# Patient Record
Sex: Female | Born: 1945 | Race: White | Hispanic: No | Smoking: Never smoker
Health system: Southern US, Community
[De-identification: ages and names within clinical notes are randomized; demographics above are authoritative.]

## PROBLEM LIST (undated history)

## (undated) DIAGNOSIS — E119 Type 2 diabetes mellitus without complications: Secondary | ICD-10-CM

## (undated) DIAGNOSIS — I10 Essential (primary) hypertension: Secondary | ICD-10-CM

## (undated) DIAGNOSIS — M8580 Other specified disorders of bone density and structure, unspecified site: Secondary | ICD-10-CM

## (undated) DIAGNOSIS — Z9109 Other allergy status, other than to drugs and biological substances: Secondary | ICD-10-CM

## (undated) HISTORY — DX: Other specified disorders of bone density and structure, unspecified site: M85.80

## (undated) HISTORY — DX: Other allergy status, other than to drugs and biological substances: Z91.09

---

## 1998-06-07 ENCOUNTER — Other Ambulatory Visit: Admission: RE | Admit: 1998-06-07 | Discharge: 1998-06-07 | Payer: Self-pay | Admitting: Obstetrics and Gynecology

## 2000-10-06 ENCOUNTER — Other Ambulatory Visit: Admission: RE | Admit: 2000-10-06 | Discharge: 2000-10-06 | Payer: Self-pay | Admitting: Obstetrics and Gynecology

## 2001-12-06 ENCOUNTER — Encounter: Admission: RE | Admit: 2001-12-06 | Discharge: 2002-03-06 | Payer: Self-pay | Admitting: Internal Medicine

## 2008-03-10 ENCOUNTER — Telehealth: Payer: Self-pay | Admitting: Internal Medicine

## 2012-07-30 ENCOUNTER — Emergency Department (HOSPITAL_BASED_OUTPATIENT_CLINIC_OR_DEPARTMENT_OTHER)
Admission: EM | Admit: 2012-07-30 | Discharge: 2012-07-30 | Disposition: A | Discharge status: Home / Self Care | Payer: BC Managed Care – PPO | Attending: Emergency Medicine | Admitting: Emergency Medicine

## 2012-07-30 ENCOUNTER — Encounter (HOSPITAL_BASED_OUTPATIENT_CLINIC_OR_DEPARTMENT_OTHER): Payer: Self-pay | Admitting: *Deleted

## 2012-07-30 ENCOUNTER — Emergency Department (HOSPITAL_BASED_OUTPATIENT_CLINIC_OR_DEPARTMENT_OTHER): Payer: BC Managed Care – PPO

## 2012-07-30 DIAGNOSIS — S43014A Anterior dislocation of right humerus, initial encounter: Secondary | ICD-10-CM

## 2012-07-30 DIAGNOSIS — Y92410 Unspecified street and highway as the place of occurrence of the external cause: Secondary | ICD-10-CM | POA: Insufficient documentation

## 2012-07-30 DIAGNOSIS — E119 Type 2 diabetes mellitus without complications: Secondary | ICD-10-CM | POA: Insufficient documentation

## 2012-07-30 DIAGNOSIS — I10 Essential (primary) hypertension: Secondary | ICD-10-CM | POA: Insufficient documentation

## 2012-07-30 DIAGNOSIS — W1789XA Other fall from one level to another, initial encounter: Secondary | ICD-10-CM | POA: Insufficient documentation

## 2012-07-30 DIAGNOSIS — Y9301 Activity, walking, marching and hiking: Secondary | ICD-10-CM | POA: Insufficient documentation

## 2012-07-30 DIAGNOSIS — Z79899 Other long term (current) drug therapy: Secondary | ICD-10-CM | POA: Insufficient documentation

## 2012-07-30 DIAGNOSIS — Z7982 Long term (current) use of aspirin: Secondary | ICD-10-CM | POA: Insufficient documentation

## 2012-07-30 DIAGNOSIS — S43006A Unspecified dislocation of unspecified shoulder joint, initial encounter: Secondary | ICD-10-CM | POA: Insufficient documentation

## 2012-07-30 DIAGNOSIS — IMO0001 Reserved for inherently not codable concepts without codable children: Secondary | ICD-10-CM

## 2012-07-30 HISTORY — DX: Type 2 diabetes mellitus without complications: E11.9

## 2012-07-30 HISTORY — DX: Essential (primary) hypertension: I10

## 2012-07-30 MED ORDER — HYDROCODONE-ACETAMINOPHEN 5-500 MG PO TABS: 1.0000 | ORAL_TABLET | Freq: Four times a day (QID) | ORAL | Status: DC | PRN

## 2012-07-30 MED ORDER — HYDROMORPHONE HCL PF 1 MG/ML IJ SOLN
INTRAMUSCULAR | Status: AC
  Administered 2012-07-30: 1 mg
  Filled 2012-07-30: qty 1

## 2012-07-30 NOTE — ED Notes (Signed)
Pt amb to triage with slow, steady gait in nad. Pt reports trip and fall on uneven pavement today around 11am.  Pt states she has right arm pain "from shoulder to fingertip". Denies any head injury or loc, + radial pulses, rom + to wrist, fingers, decrease rom to shoulder and elbow due to pain, no swelling or deformity noted.

## 2012-07-30 NOTE — ED Provider Notes (Signed)
History     CSN: [id number]  Arrival date & time 07/30/12  1348   First MD Initiated Contact with Patient 07/30/12 1552      Chief Complaint  Patient presents with  . Fall    (Consider location/radiation/quality/duration/timing/severity/associated sxs/prior treatment) HPI Comments: Patient was walking on uneven pavement and caught her shoe and fell.  She landed on her right shoulder and elbow and is complaining of pain in these areas.  She had a fall several weeks ago in which she injured the same.  She was seen by her pcp and no xrays were done as she had normal range of motion.    Patient is a 66 y.o. female presenting with fall. The history is provided by the patient.  Fall The accident occurred 1 to 2 hours ago. The fall occurred while walking. She fell from a height of 1 to 2 ft. She landed on concrete. There was no blood loss. Point of impact: right arm and shoulder. Pain location: right arm and shoulder. The pain is moderate. She was ambulatory at the scene. There was no entrapment after the fall.    Past Medical History  Diagnosis Date  . Diabetes mellitus without complication   . Hypertension     History reviewed. No pertinent past surgical history.  History reviewed. No pertinent family history.  History  Substance Use Topics  . Smoking status: Never Smoker   . Smokeless tobacco: Not on file  . Alcohol Use:     OB History    Grav Para Term Preterm Abortions TAB SAB Ect Mult Living                  Review of Systems  All other systems reviewed and are negative.    Allergies  Erythromycin  Home Medications   Current Outpatient Rx  Name  Route  Sig  Dispense  Refill  . ASPIRIN 81 MG PO TABS   Oral   Take 81 mg by mouth daily.         . ATENOLOL-CHLORTHALIDONE 50-25 MG PO TABS   Oral   Take 0.5 tablets by mouth daily.         . ATORVASTATIN CALCIUM 20 MG PO TABS   Oral   Take 20 mg by mouth daily.         . DESLORATADINE 5 MG PO TABS  Oral   Take 5 mg by mouth daily.         Marland Kitchen EXENATIDE 2 MG Hendricks SUSR   Subcutaneous   Inject into the skin once a week.         Marland Kitchen GLIPIZIDE 10 MG PO TABS   Oral   Take 10 mg by mouth 2 (two) times daily before a meal.         . METFORMIN HCL 500 MG PO TABS   Oral   Take 500 mg by mouth 2 (two) times daily with a meal.           BP 159/76  Pulse 83  Temp 98.2 F (36.8 C) (Oral)  Resp 18  SpO2 100%  Physical Exam  Nursing note and vitals reviewed. Constitutional: She is oriented to person, place, and time. She appears well-developed and well-nourished. No distress.  HENT:  Head: Normocephalic and atraumatic.  Neck: Normal range of motion. Neck supple.  Musculoskeletal:       The right shoulder and elbow appear grossly normal.  There is pain with range of motion.  The distal right extremity is neurovascularly intact.  Neurological: She is alert and oriented to person, place, and time.  Skin: Skin is warm and dry. She is not diaphoretic.    ED Course  Reduction of dislocation Performed by: Geoffery Lyons Authorized by: Geoffery Lyons Consent: Verbal consent obtained. Written consent not obtained. Risks and benefits: risks, benefits and alternatives were discussed Consent given by: patient Patient understanding: patient states understanding of the procedure being performed Patient identity confirmed: verbally with patient and arm band Local anesthesia used: no Patient sedated: no Patient tolerance: Patient tolerated the procedure well with no immediate complications. Comments: Shoulder reduced with traction/counter-traction technique.   (including critical care time)  Labs Reviewed - No data to display No results found.   No diagnosis found.    MDM  The xrays reveal a right shoulder dislocation.  She was given dilaudid and was more comfortable.  She did not want to be sedated and was willing to attempt reduction without further sedatives.  The right shoulder  was reduced with traction-countertraction technique and she tolerated this quite well.  The right upper extremity was neurovascularly intact pre and post reduction.  She was placed in a sling and swath, to follow up prn.        Geoffery Lyons, MD 07/31/12 [phone]

## 2012-08-31 ENCOUNTER — Other Ambulatory Visit: Payer: Self-pay | Admitting: Orthopaedic Surgery

## 2012-08-31 DIAGNOSIS — R52 Pain, unspecified: Secondary | ICD-10-CM

## 2012-08-31 DIAGNOSIS — R531 Weakness: Secondary | ICD-10-CM

## 2012-09-08 ENCOUNTER — Ambulatory Visit
Admission: RE | Admit: 2012-09-08 | Discharge: 2012-09-08 | Disposition: A | Discharge status: Home / Self Care | Payer: BC Managed Care – PPO | Source: Ambulatory Visit | Attending: Orthopaedic Surgery | Admitting: Orthopaedic Surgery

## 2012-09-08 DIAGNOSIS — R531 Weakness: Secondary | ICD-10-CM

## 2012-09-08 DIAGNOSIS — R52 Pain, unspecified: Secondary | ICD-10-CM

## 2012-09-22 HISTORY — PX: SHOULDER OPEN ROTATOR CUFF REPAIR: SHX2407

## 2012-10-08 ENCOUNTER — Ambulatory Visit: Payer: BC Managed Care – PPO | Attending: Orthopaedic Surgery | Admitting: Rehabilitation

## 2012-10-08 DIAGNOSIS — IMO0001 Reserved for inherently not codable concepts without codable children: Secondary | ICD-10-CM | POA: Insufficient documentation

## 2012-10-08 DIAGNOSIS — M25519 Pain in unspecified shoulder: Secondary | ICD-10-CM | POA: Insufficient documentation

## 2012-10-08 DIAGNOSIS — M25619 Stiffness of unspecified shoulder, not elsewhere classified: Secondary | ICD-10-CM | POA: Insufficient documentation

## 2012-10-11 ENCOUNTER — Ambulatory Visit: Payer: BC Managed Care – PPO | Admitting: Rehabilitation

## 2012-10-14 ENCOUNTER — Ambulatory Visit: Payer: BC Managed Care – PPO | Admitting: Rehabilitation

## 2012-10-18 ENCOUNTER — Ambulatory Visit: Payer: BC Managed Care – PPO | Admitting: Rehabilitation

## 2012-10-21 ENCOUNTER — Ambulatory Visit: Payer: BC Managed Care – PPO | Admitting: Rehabilitation

## 2012-10-25 ENCOUNTER — Ambulatory Visit: Payer: BC Managed Care – PPO | Attending: Orthopaedic Surgery | Admitting: Rehabilitation

## 2012-10-25 DIAGNOSIS — IMO0001 Reserved for inherently not codable concepts without codable children: Secondary | ICD-10-CM | POA: Insufficient documentation

## 2012-10-25 DIAGNOSIS — M25619 Stiffness of unspecified shoulder, not elsewhere classified: Secondary | ICD-10-CM | POA: Insufficient documentation

## 2012-10-25 DIAGNOSIS — M25519 Pain in unspecified shoulder: Secondary | ICD-10-CM | POA: Insufficient documentation

## 2012-10-29 ENCOUNTER — Ambulatory Visit: Payer: BC Managed Care – PPO | Admitting: Rehabilitation

## 2012-11-01 ENCOUNTER — Ambulatory Visit: Payer: BC Managed Care – PPO | Admitting: Rehabilitation

## 2012-11-04 ENCOUNTER — Ambulatory Visit: Payer: BC Managed Care – PPO | Admitting: Rehabilitation

## 2012-11-08 ENCOUNTER — Ambulatory Visit: Payer: BC Managed Care – PPO | Admitting: Rehabilitation

## 2012-11-11 ENCOUNTER — Ambulatory Visit: Payer: BC Managed Care – PPO | Admitting: Rehabilitation

## 2012-11-15 ENCOUNTER — Ambulatory Visit: Payer: BC Managed Care – PPO | Admitting: Rehabilitation

## 2012-11-18 ENCOUNTER — Ambulatory Visit: Payer: BC Managed Care – PPO | Admitting: Rehabilitation

## 2012-11-22 ENCOUNTER — Ambulatory Visit: Payer: Private Health Insurance - Indemnity | Attending: Orthopaedic Surgery | Admitting: Rehabilitation

## 2012-11-22 DIAGNOSIS — M25619 Stiffness of unspecified shoulder, not elsewhere classified: Secondary | ICD-10-CM | POA: Insufficient documentation

## 2012-11-22 DIAGNOSIS — IMO0001 Reserved for inherently not codable concepts without codable children: Secondary | ICD-10-CM | POA: Insufficient documentation

## 2012-11-22 DIAGNOSIS — M25519 Pain in unspecified shoulder: Secondary | ICD-10-CM | POA: Insufficient documentation

## 2012-11-24 ENCOUNTER — Ambulatory Visit: Payer: Private Health Insurance - Indemnity | Admitting: Rehabilitation

## 2012-11-26 ENCOUNTER — Encounter: Payer: BC Managed Care – PPO | Admitting: Rehabilitation

## 2012-11-29 ENCOUNTER — Ambulatory Visit: Payer: Private Health Insurance - Indemnity | Admitting: Rehabilitation

## 2012-12-02 ENCOUNTER — Ambulatory Visit: Payer: Private Health Insurance - Indemnity | Admitting: Rehabilitation

## 2012-12-06 ENCOUNTER — Ambulatory Visit: Payer: Private Health Insurance - Indemnity | Admitting: Rehabilitation

## 2012-12-08 ENCOUNTER — Ambulatory Visit: Payer: Private Health Insurance - Indemnity | Admitting: Rehabilitation

## 2012-12-13 ENCOUNTER — Ambulatory Visit: Payer: Private Health Insurance - Indemnity | Admitting: Rehabilitation

## 2012-12-16 ENCOUNTER — Ambulatory Visit: Payer: Private Health Insurance - Indemnity | Admitting: Rehabilitation

## 2012-12-20 ENCOUNTER — Ambulatory Visit: Payer: Private Health Insurance - Indemnity | Admitting: Rehabilitation

## 2012-12-22 ENCOUNTER — Ambulatory Visit: Payer: Worker's Compensation | Attending: Orthopaedic Surgery | Admitting: Rehabilitation

## 2012-12-22 DIAGNOSIS — IMO0001 Reserved for inherently not codable concepts without codable children: Secondary | ICD-10-CM | POA: Insufficient documentation

## 2012-12-22 DIAGNOSIS — M25519 Pain in unspecified shoulder: Secondary | ICD-10-CM | POA: Insufficient documentation

## 2012-12-22 DIAGNOSIS — M25619 Stiffness of unspecified shoulder, not elsewhere classified: Secondary | ICD-10-CM | POA: Insufficient documentation

## 2012-12-27 ENCOUNTER — Ambulatory Visit: Payer: Worker's Compensation | Admitting: Rehabilitation

## 2012-12-29 ENCOUNTER — Ambulatory Visit: Payer: Worker's Compensation | Admitting: Rehabilitation

## 2013-01-03 ENCOUNTER — Encounter: Payer: Private Health Insurance - Indemnity | Admitting: Rehabilitation

## 2013-01-05 ENCOUNTER — Ambulatory Visit: Payer: Private Health Insurance - Indemnity | Attending: Orthopaedic Surgery | Admitting: Rehabilitation

## 2013-01-05 ENCOUNTER — Encounter: Payer: Private Health Insurance - Indemnity | Admitting: Rehabilitation

## 2013-01-05 DIAGNOSIS — M25519 Pain in unspecified shoulder: Secondary | ICD-10-CM | POA: Insufficient documentation

## 2013-01-05 DIAGNOSIS — M25619 Stiffness of unspecified shoulder, not elsewhere classified: Secondary | ICD-10-CM | POA: Insufficient documentation

## 2013-01-05 DIAGNOSIS — IMO0001 Reserved for inherently not codable concepts without codable children: Secondary | ICD-10-CM | POA: Insufficient documentation

## 2013-01-06 ENCOUNTER — Ambulatory Visit: Payer: Private Health Insurance - Indemnity | Admitting: Rehabilitation

## 2013-01-10 ENCOUNTER — Ambulatory Visit: Payer: Private Health Insurance - Indemnity | Admitting: Rehabilitation

## 2013-01-12 ENCOUNTER — Ambulatory Visit: Payer: Private Health Insurance - Indemnity | Admitting: Rehabilitation

## 2013-01-17 ENCOUNTER — Ambulatory Visit: Payer: Private Health Insurance - Indemnity | Admitting: Rehabilitation

## 2013-01-19 ENCOUNTER — Ambulatory Visit: Payer: Private Health Insurance - Indemnity | Admitting: Rehabilitation

## 2013-01-24 ENCOUNTER — Ambulatory Visit: Payer: Worker's Compensation | Attending: Orthopaedic Surgery | Admitting: Rehabilitation

## 2013-01-24 DIAGNOSIS — IMO0001 Reserved for inherently not codable concepts without codable children: Secondary | ICD-10-CM | POA: Insufficient documentation

## 2013-01-24 DIAGNOSIS — M25619 Stiffness of unspecified shoulder, not elsewhere classified: Secondary | ICD-10-CM | POA: Insufficient documentation

## 2013-01-24 DIAGNOSIS — M25519 Pain in unspecified shoulder: Secondary | ICD-10-CM | POA: Insufficient documentation

## 2013-01-26 ENCOUNTER — Ambulatory Visit: Payer: Worker's Compensation | Admitting: Rehabilitation

## 2013-01-31 ENCOUNTER — Ambulatory Visit: Payer: Worker's Compensation | Admitting: Rehabilitation

## 2013-02-02 ENCOUNTER — Ambulatory Visit: Payer: Worker's Compensation | Admitting: Rehabilitation

## 2013-02-03 ENCOUNTER — Encounter: Payer: Self-pay | Admitting: Internal Medicine

## 2013-02-07 ENCOUNTER — Ambulatory Visit: Payer: Worker's Compensation | Admitting: Rehabilitation

## 2013-02-11 ENCOUNTER — Encounter: Payer: Self-pay | Admitting: Internal Medicine

## 2013-04-01 ENCOUNTER — Encounter: Payer: Self-pay | Admitting: Internal Medicine

## 2013-04-07 ENCOUNTER — Ambulatory Visit (AMBULATORY_SURGERY_CENTER): Payer: Private Health Insurance - Indemnity | Admitting: *Deleted

## 2013-04-07 VITALS — Ht 62.5 in | Wt 164.4 lb

## 2013-04-07 DIAGNOSIS — Z1211 Encounter for screening for malignant neoplasm of colon: Secondary | ICD-10-CM

## 2013-04-07 MED ORDER — MOVIPREP 100 G PO SOLR: 1.0000 | Freq: Once | ORAL | Status: DC

## 2013-04-07 NOTE — Progress Notes (Signed)
Denies allergies to eggs or soy products. Denies complications with anesthesia or sedation. 

## 2013-04-12 ENCOUNTER — Encounter: Payer: Self-pay | Admitting: Internal Medicine

## 2013-04-26 ENCOUNTER — Ambulatory Visit (AMBULATORY_SURGERY_CENTER): Payer: Private Health Insurance - Indemnity | Admitting: Internal Medicine

## 2013-04-26 ENCOUNTER — Encounter: Payer: Self-pay | Admitting: Internal Medicine

## 2013-04-26 VITALS — BP 107/47 | HR 71 | Temp 98.3°F | Resp 29 | Ht 62.5 in | Wt 164.0 lb

## 2013-04-26 DIAGNOSIS — Z1211 Encounter for screening for malignant neoplasm of colon: Secondary | ICD-10-CM

## 2013-04-26 DIAGNOSIS — D126 Benign neoplasm of colon, unspecified: Secondary | ICD-10-CM

## 2013-04-26 MED ORDER — SODIUM CHLORIDE 0.9 % IV SOLN: 500.0000 mL | INTRAVENOUS | Status: DC

## 2013-04-26 NOTE — Op Note (Signed)
[facility] [address] [place], [address]   COLONOSCOPY PROCEDURE REPORT  PATIENT: Kelsey, Carpenter  MR#: [id number] BIRTHDATE: 28-Oct-1945 , 66  yrs. old GENDER: Female ENDOSCOPIST: Roxy Cedar, MD REFERRED AO:[id number] Recall PROCEDURE DATE:  04/26/2013 PROCEDURE:   Colonoscopy with snare polypectomy x 1 First Screening Colonoscopy - Avg.  risk and is 50 yrs.  old or older - No.  Prior Negative Screening - Now for repeat screening. 10 or more years since last screening  History of Adenoma - Now for follow-up colonoscopy & has been > or = to 3 yrs.  N/A  Polyps Removed Today? Yes. ASA CLASS:   Class II INDICATIONS:average risk screening.   Negative index exam 02-2003 MEDICATIONS: MAC sedation, administered by CRNA and propofol (Diprivan) 350mg  IV  DESCRIPTION OF PROCEDURE:   After the risks benefits and alternatives of the procedure were thoroughly explained, informed consent was obtained.  A digital rectal exam revealed no abnormalities of the rectum.   The [id number]  endoscope was introduced through the anus and advanced to the cecum, which was identified by both the appendix and ileocecal valve. No adverse events experienced.   The quality of the prep was excellent, using MoviPrep  The instrument was then slowly withdrawn as the colon was fully examined.    COLON FINDINGS: A diminutive polyp was found in the ascending colon. A polypectomy was performed with a cold snare.  The resection was complete and the polyp tissue was completely retrieved.   The colon mucosa was otherwise normal.  Retroflexed views revealed no abnormalities. The time to cecum=7 minutes 02 seconds.  Withdrawal time=12 minutes 34 seconds.  The scope was withdrawn and the procedure completed. COMPLICATIONS: There were no complications.  ENDOSCOPIC IMPRESSION: 1.   Diminutive polyp was found in the ascending colon; polypectomy was performed with a cold snare 2.   The  colon mucosa was otherwise normal  RECOMMENDATIONS: 1. Repeat colonoscopy in 5 years if polyp adenomatous; otherwise 10 years   eSigned:  Roxy Cedar, MD 04/26/2013 11:21 AM   cc: Geoffry Paradise, MD and The Patient   PATIENT NAME:  Kelsey, Carpenter MR#: [id number]

## 2013-04-26 NOTE — Progress Notes (Signed)
Called to room to assist during endoscopic procedure.  Patient ID and intended procedure confirmed with present staff. Received instructions for my participation in the procedure from the performing physician.  

## 2013-04-26 NOTE — Patient Instructions (Addendum)

## 2013-04-26 NOTE — Progress Notes (Signed)
Patient did not have preoperative order for IV antibiotic SSI prophylaxis. (G8918)  Patient did not experience any of the following events: a burn prior to discharge; a fall within the facility; wrong site/side/patient/procedure/implant event; or a hospital transfer or hospital admission upon discharge from the facility. (G8907)  

## 2013-04-27 ENCOUNTER — Telehealth: Payer: Self-pay | Admitting: *Deleted

## 2013-04-27 NOTE — Telephone Encounter (Signed)
  Follow up Call-  Call back number 04/26/2013  Post procedure Call Back phone  # [phone]  Permission to leave phone message Yes     Patient questions:  Do you have a fever, pain , or abdominal swelling? no Pain Score  0 *  Have you tolerated food without any problems? yes  Have you been able to return to your normal activities? yes  Do you have any questions about your discharge instructions: Diet   no Medications  no Follow up visit  no  Do you have questions or concerns about your Care? no  Actions: * If pain score is 4 or above: No action needed, pain <4.

## 2013-05-09 ENCOUNTER — Encounter: Payer: Self-pay | Admitting: Internal Medicine

## 2013-07-01 DIAGNOSIS — J309 Allergic rhinitis, unspecified: Secondary | ICD-10-CM | POA: Diagnosis not present

## 2013-07-01 DIAGNOSIS — Z1231 Encounter for screening mammogram for malignant neoplasm of breast: Secondary | ICD-10-CM | POA: Diagnosis not present

## 2013-07-11 DIAGNOSIS — J309 Allergic rhinitis, unspecified: Secondary | ICD-10-CM | POA: Diagnosis not present

## 2013-07-11 IMAGING — CR DG SHOULDER 2+V*R*
3 series · 3 of 3 positions shown · non-contrast
Comparison: None.

CLINICAL DATA: Post fall

RIGHT SHOULDER - 2+ VIEW

[w shoulder ap internal righ]
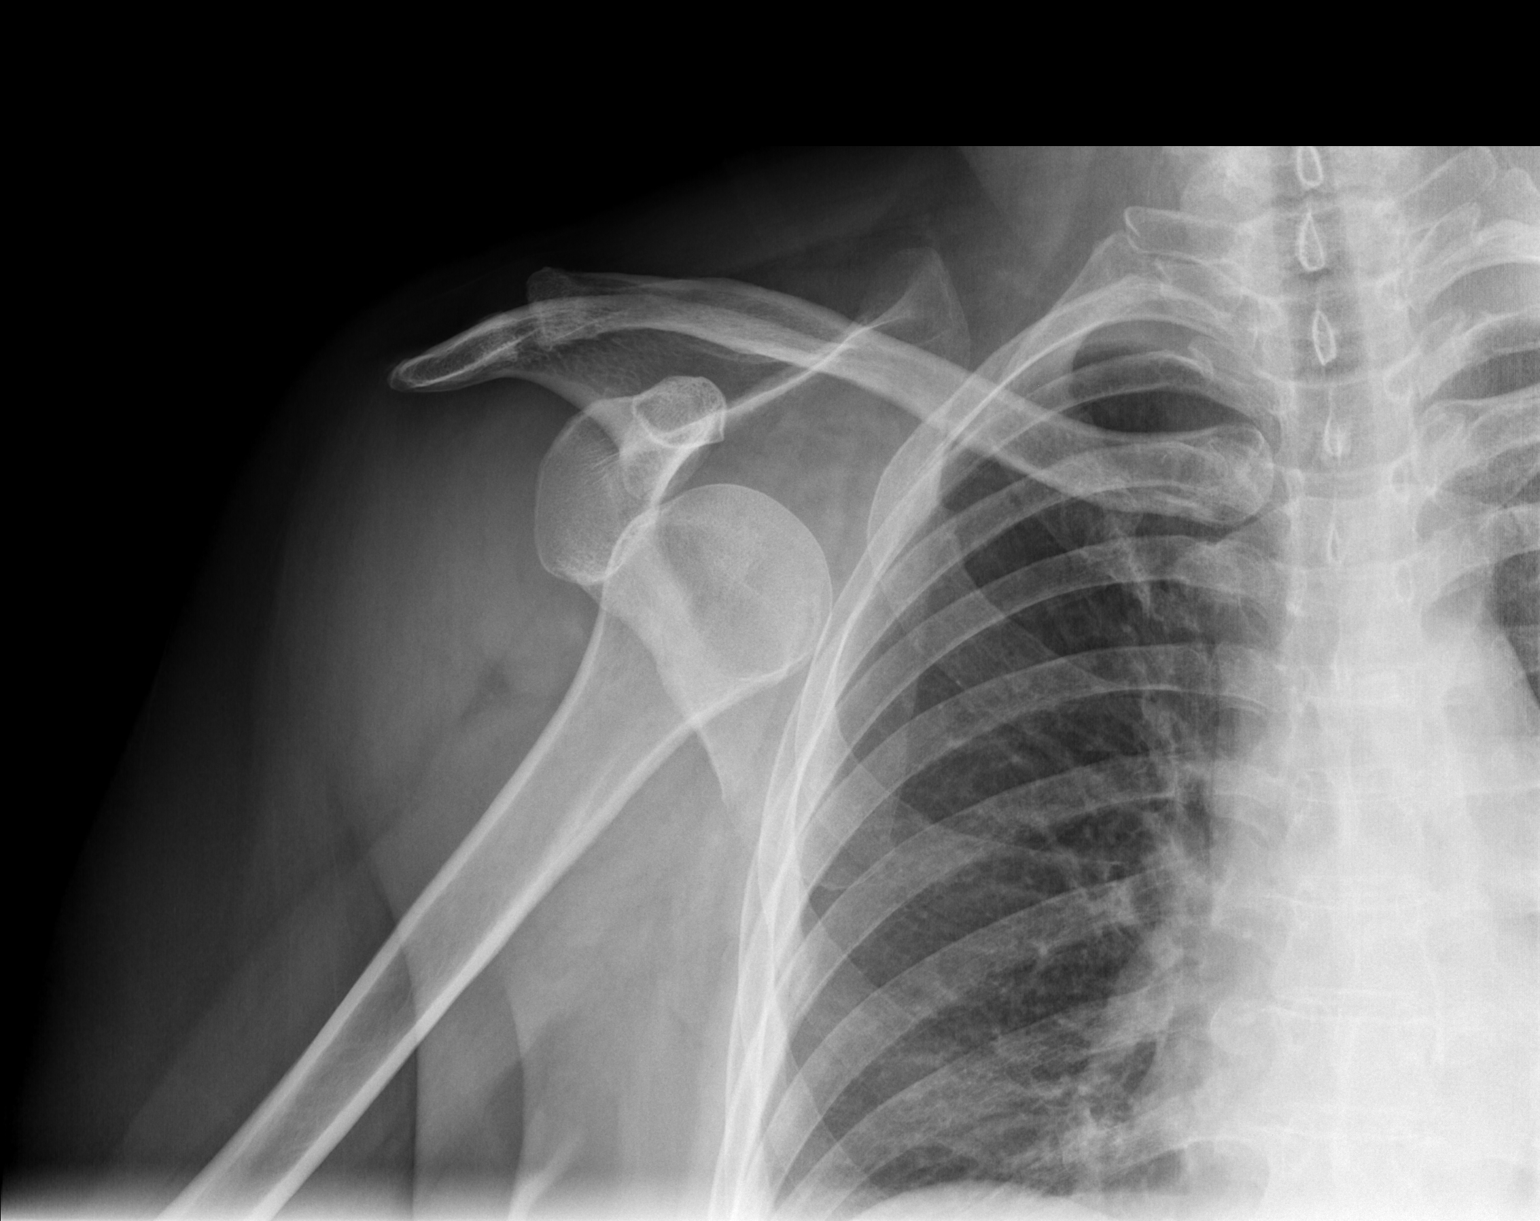

[w shoulder ap external righ]
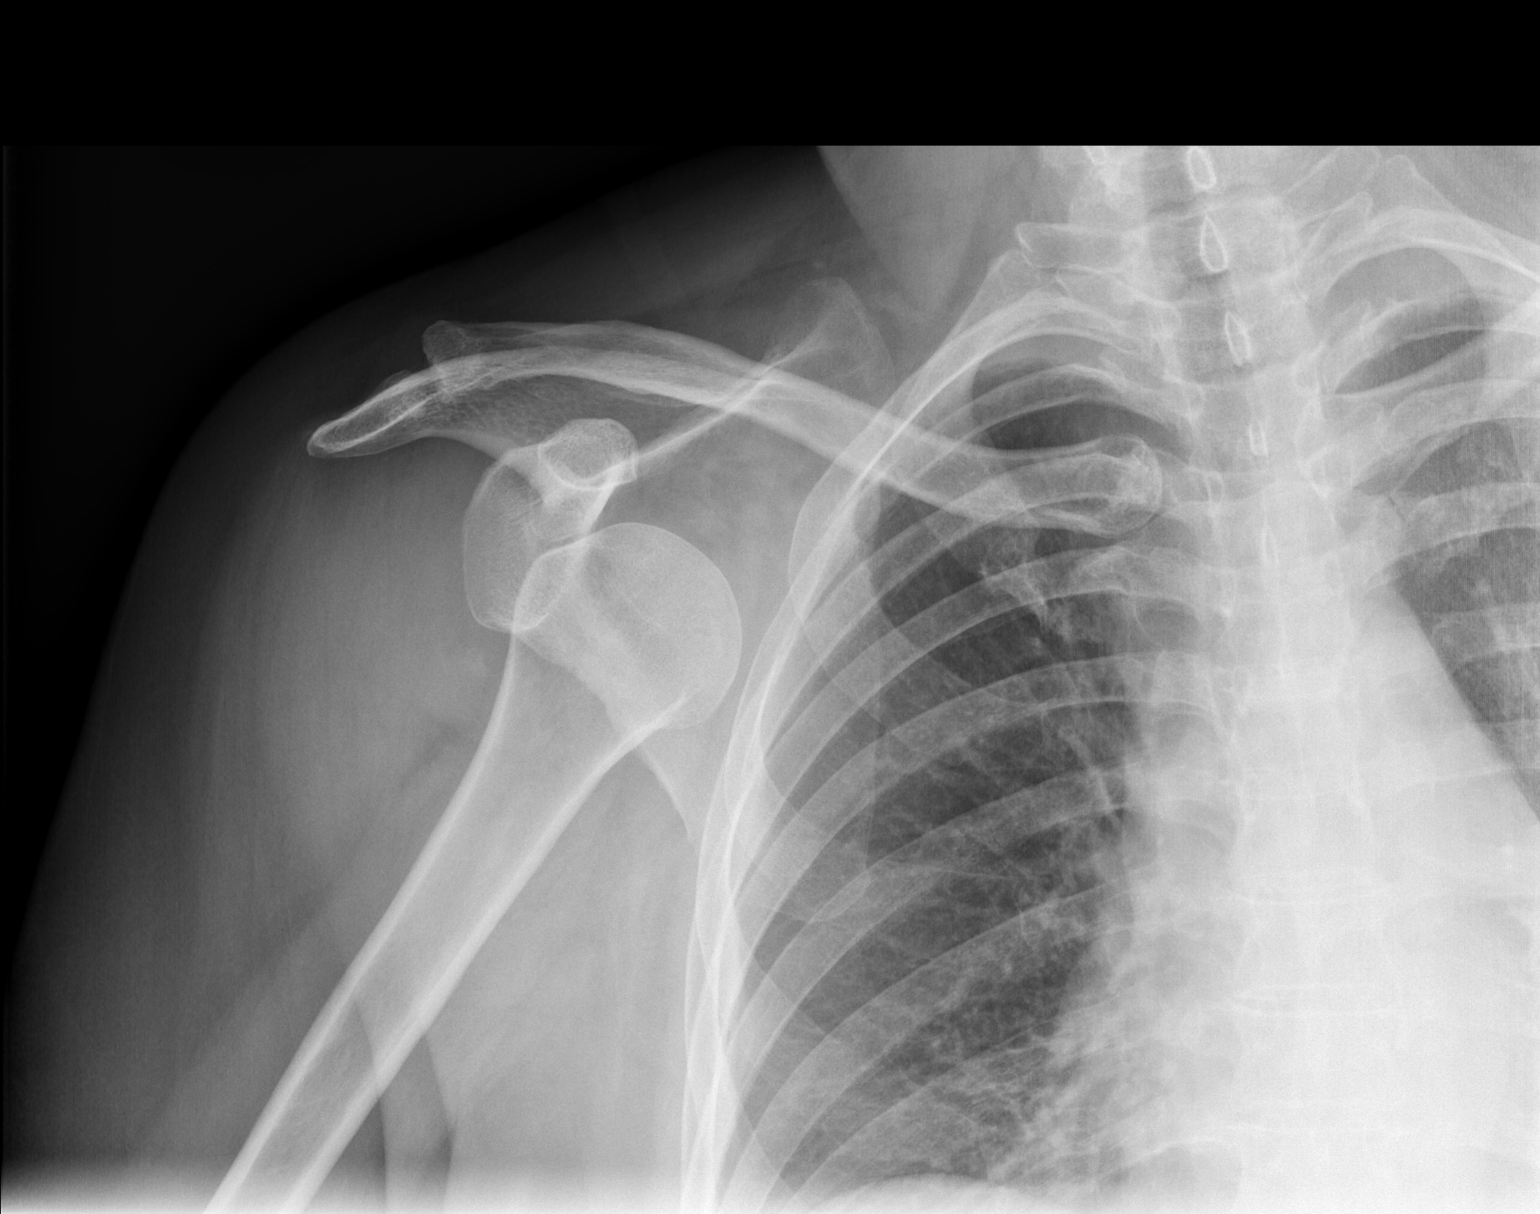

[w shoulder y view right]
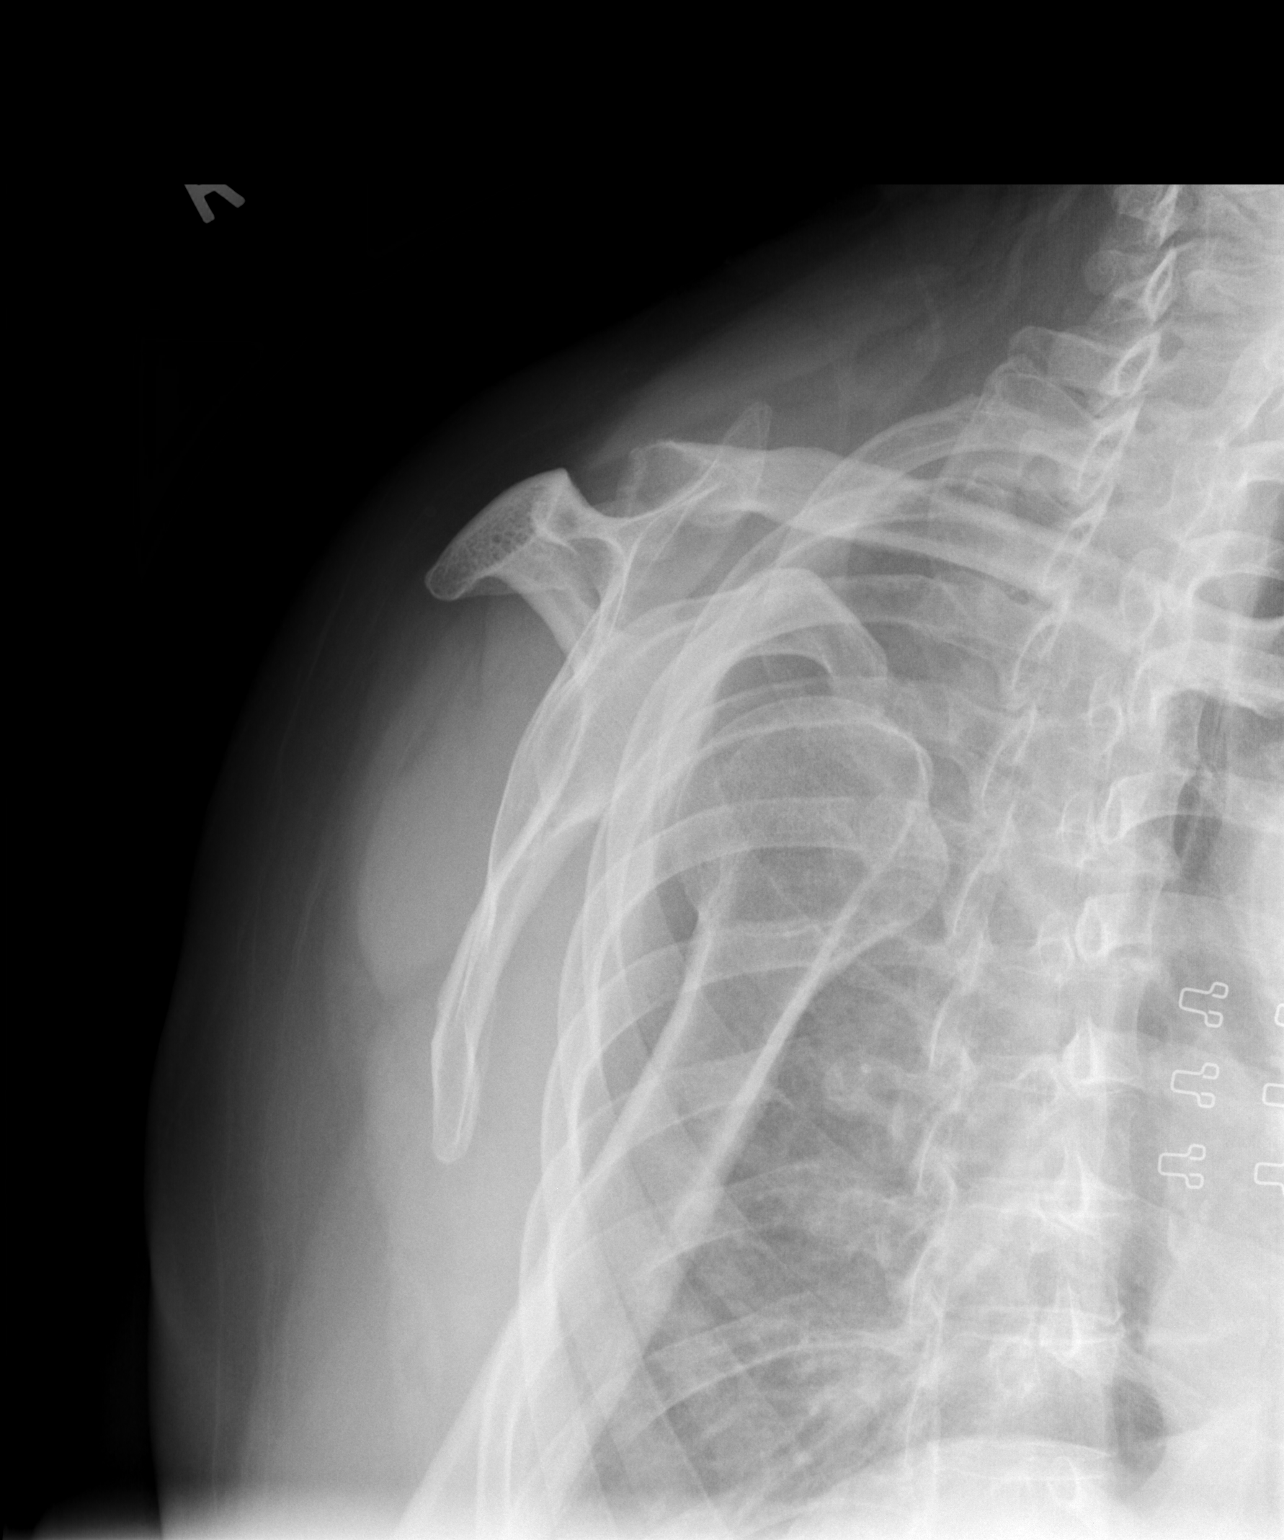

[3 of 3 positions shown; findings below may reference images not displayed]

FINDINGS: Three views of the right shoulder submitted.  There is
anterior subluxation of the right humeral head from glenohumeral
joint. No acute fracture is identified.
IMPRESSION: Anterior shoulder dislocation.

## 2013-07-21 DIAGNOSIS — J309 Allergic rhinitis, unspecified: Secondary | ICD-10-CM | POA: Diagnosis not present

## 2013-08-01 DIAGNOSIS — J309 Allergic rhinitis, unspecified: Secondary | ICD-10-CM | POA: Diagnosis not present

## 2013-08-16 DIAGNOSIS — J309 Allergic rhinitis, unspecified: Secondary | ICD-10-CM | POA: Diagnosis not present

## 2013-08-26 DIAGNOSIS — Z6829 Body mass index (BMI) 29.0-29.9, adult: Secondary | ICD-10-CM | POA: Diagnosis not present

## 2013-08-26 DIAGNOSIS — E785 Hyperlipidemia, unspecified: Secondary | ICD-10-CM | POA: Diagnosis not present

## 2013-08-26 DIAGNOSIS — M1990 Unspecified osteoarthritis, unspecified site: Secondary | ICD-10-CM | POA: Diagnosis not present

## 2013-08-26 DIAGNOSIS — E119 Type 2 diabetes mellitus without complications: Secondary | ICD-10-CM | POA: Diagnosis not present

## 2013-08-26 DIAGNOSIS — Z1331 Encounter for screening for depression: Secondary | ICD-10-CM | POA: Diagnosis not present

## 2013-08-26 DIAGNOSIS — I10 Essential (primary) hypertension: Secondary | ICD-10-CM | POA: Diagnosis not present

## 2013-08-26 DIAGNOSIS — J309 Allergic rhinitis, unspecified: Secondary | ICD-10-CM | POA: Diagnosis not present

## 2013-09-06 DIAGNOSIS — J309 Allergic rhinitis, unspecified: Secondary | ICD-10-CM | POA: Diagnosis not present

## 2013-09-19 DIAGNOSIS — Z01419 Encounter for gynecological examination (general) (routine) without abnormal findings: Secondary | ICD-10-CM | POA: Diagnosis not present

## 2013-09-19 DIAGNOSIS — J309 Allergic rhinitis, unspecified: Secondary | ICD-10-CM | POA: Diagnosis not present

## 2013-09-19 DIAGNOSIS — Z124 Encounter for screening for malignant neoplasm of cervix: Secondary | ICD-10-CM | POA: Diagnosis not present

## 2013-09-30 DIAGNOSIS — J309 Allergic rhinitis, unspecified: Secondary | ICD-10-CM | POA: Diagnosis not present

## 2013-10-10 DIAGNOSIS — J309 Allergic rhinitis, unspecified: Secondary | ICD-10-CM | POA: Diagnosis not present

## 2013-10-11 DIAGNOSIS — J309 Allergic rhinitis, unspecified: Secondary | ICD-10-CM | POA: Diagnosis not present

## 2013-10-20 DIAGNOSIS — J309 Allergic rhinitis, unspecified: Secondary | ICD-10-CM | POA: Diagnosis not present

## 2013-10-31 DIAGNOSIS — J309 Allergic rhinitis, unspecified: Secondary | ICD-10-CM | POA: Diagnosis not present

## 2013-11-11 DIAGNOSIS — J309 Allergic rhinitis, unspecified: Secondary | ICD-10-CM | POA: Diagnosis not present

## 2013-11-21 DIAGNOSIS — J309 Allergic rhinitis, unspecified: Secondary | ICD-10-CM | POA: Diagnosis not present

## 2013-11-25 DIAGNOSIS — J309 Allergic rhinitis, unspecified: Secondary | ICD-10-CM | POA: Diagnosis not present

## 2013-11-28 DIAGNOSIS — J309 Allergic rhinitis, unspecified: Secondary | ICD-10-CM | POA: Diagnosis not present

## 2013-12-01 DIAGNOSIS — J309 Allergic rhinitis, unspecified: Secondary | ICD-10-CM | POA: Diagnosis not present

## 2013-12-05 DIAGNOSIS — J309 Allergic rhinitis, unspecified: Secondary | ICD-10-CM | POA: Diagnosis not present

## 2013-12-15 DIAGNOSIS — J309 Allergic rhinitis, unspecified: Secondary | ICD-10-CM | POA: Diagnosis not present

## 2013-12-26 DIAGNOSIS — J309 Allergic rhinitis, unspecified: Secondary | ICD-10-CM | POA: Diagnosis not present

## 2013-12-30 DIAGNOSIS — I10 Essential (primary) hypertension: Secondary | ICD-10-CM | POA: Diagnosis not present

## 2013-12-30 DIAGNOSIS — Z6829 Body mass index (BMI) 29.0-29.9, adult: Secondary | ICD-10-CM | POA: Diagnosis not present

## 2013-12-30 DIAGNOSIS — E785 Hyperlipidemia, unspecified: Secondary | ICD-10-CM | POA: Diagnosis not present

## 2013-12-30 DIAGNOSIS — E119 Type 2 diabetes mellitus without complications: Secondary | ICD-10-CM | POA: Diagnosis not present

## 2013-12-30 DIAGNOSIS — E669 Obesity, unspecified: Secondary | ICD-10-CM | POA: Diagnosis not present

## 2014-01-06 DIAGNOSIS — J309 Allergic rhinitis, unspecified: Secondary | ICD-10-CM | POA: Diagnosis not present

## 2014-01-18 DIAGNOSIS — J309 Allergic rhinitis, unspecified: Secondary | ICD-10-CM | POA: Diagnosis not present

## 2014-01-26 DIAGNOSIS — H5010 Unspecified exotropia: Secondary | ICD-10-CM | POA: Diagnosis not present

## 2014-01-26 DIAGNOSIS — H259 Unspecified age-related cataract: Secondary | ICD-10-CM | POA: Diagnosis not present

## 2014-01-26 DIAGNOSIS — H04129 Dry eye syndrome of unspecified lacrimal gland: Secondary | ICD-10-CM | POA: Diagnosis not present

## 2014-01-26 DIAGNOSIS — E119 Type 2 diabetes mellitus without complications: Secondary | ICD-10-CM | POA: Diagnosis not present

## 2014-02-01 DIAGNOSIS — J309 Allergic rhinitis, unspecified: Secondary | ICD-10-CM | POA: Diagnosis not present

## 2014-02-10 DIAGNOSIS — J309 Allergic rhinitis, unspecified: Secondary | ICD-10-CM | POA: Diagnosis not present

## 2014-02-20 DIAGNOSIS — J309 Allergic rhinitis, unspecified: Secondary | ICD-10-CM | POA: Diagnosis not present

## 2014-03-03 DIAGNOSIS — J309 Allergic rhinitis, unspecified: Secondary | ICD-10-CM | POA: Diagnosis not present

## 2014-03-13 DIAGNOSIS — J309 Allergic rhinitis, unspecified: Secondary | ICD-10-CM | POA: Diagnosis not present

## 2014-03-22 DIAGNOSIS — J309 Allergic rhinitis, unspecified: Secondary | ICD-10-CM | POA: Diagnosis not present

## 2014-03-31 DIAGNOSIS — J309 Allergic rhinitis, unspecified: Secondary | ICD-10-CM | POA: Diagnosis not present

## 2014-04-07 DIAGNOSIS — J309 Allergic rhinitis, unspecified: Secondary | ICD-10-CM | POA: Diagnosis not present

## 2014-04-17 DIAGNOSIS — J309 Allergic rhinitis, unspecified: Secondary | ICD-10-CM | POA: Diagnosis not present

## 2014-04-21 DIAGNOSIS — M810 Age-related osteoporosis without current pathological fracture: Secondary | ICD-10-CM | POA: Diagnosis not present

## 2014-04-21 DIAGNOSIS — I10 Essential (primary) hypertension: Secondary | ICD-10-CM | POA: Diagnosis not present

## 2014-04-21 DIAGNOSIS — E785 Hyperlipidemia, unspecified: Secondary | ICD-10-CM | POA: Diagnosis not present

## 2014-04-21 DIAGNOSIS — E119 Type 2 diabetes mellitus without complications: Secondary | ICD-10-CM | POA: Diagnosis not present

## 2014-04-27 DIAGNOSIS — J309 Allergic rhinitis, unspecified: Secondary | ICD-10-CM | POA: Diagnosis not present

## 2014-04-28 DIAGNOSIS — Z0000 Encounter for general adult medical examination without abnormal findings: Secondary | ICD-10-CM | POA: Diagnosis not present

## 2014-04-28 DIAGNOSIS — E785 Hyperlipidemia, unspecified: Secondary | ICD-10-CM | POA: Diagnosis not present

## 2014-04-28 DIAGNOSIS — M1990 Unspecified osteoarthritis, unspecified site: Secondary | ICD-10-CM | POA: Diagnosis not present

## 2014-04-28 DIAGNOSIS — Z6829 Body mass index (BMI) 29.0-29.9, adult: Secondary | ICD-10-CM | POA: Diagnosis not present

## 2014-04-28 DIAGNOSIS — E119 Type 2 diabetes mellitus without complications: Secondary | ICD-10-CM | POA: Diagnosis not present

## 2014-04-28 DIAGNOSIS — I10 Essential (primary) hypertension: Secondary | ICD-10-CM | POA: Diagnosis not present

## 2014-04-28 DIAGNOSIS — E669 Obesity, unspecified: Secondary | ICD-10-CM | POA: Diagnosis not present

## 2014-04-28 DIAGNOSIS — Z23 Encounter for immunization: Secondary | ICD-10-CM | POA: Diagnosis not present

## 2014-05-03 DIAGNOSIS — Z1212 Encounter for screening for malignant neoplasm of rectum: Secondary | ICD-10-CM | POA: Diagnosis not present

## 2014-05-08 DIAGNOSIS — J309 Allergic rhinitis, unspecified: Secondary | ICD-10-CM | POA: Diagnosis not present

## 2014-05-09 DIAGNOSIS — J3089 Other allergic rhinitis: Secondary | ICD-10-CM | POA: Diagnosis not present

## 2014-05-09 DIAGNOSIS — T6391XA Toxic effect of contact with unspecified venomous animal, accidental (unintentional), initial encounter: Secondary | ICD-10-CM | POA: Diagnosis not present

## 2014-05-09 DIAGNOSIS — J301 Allergic rhinitis due to pollen: Secondary | ICD-10-CM | POA: Diagnosis not present

## 2014-05-09 DIAGNOSIS — J3081 Allergic rhinitis due to animal (cat) (dog) hair and dander: Secondary | ICD-10-CM | POA: Diagnosis not present

## 2014-05-10 DIAGNOSIS — M810 Age-related osteoporosis without current pathological fracture: Secondary | ICD-10-CM | POA: Diagnosis not present

## 2014-05-17 DIAGNOSIS — J309 Allergic rhinitis, unspecified: Secondary | ICD-10-CM | POA: Diagnosis not present

## 2014-05-19 DIAGNOSIS — J309 Allergic rhinitis, unspecified: Secondary | ICD-10-CM | POA: Diagnosis not present

## 2014-05-25 DIAGNOSIS — J309 Allergic rhinitis, unspecified: Secondary | ICD-10-CM | POA: Diagnosis not present

## 2014-06-01 DIAGNOSIS — J309 Allergic rhinitis, unspecified: Secondary | ICD-10-CM | POA: Diagnosis not present

## 2014-06-08 DIAGNOSIS — J309 Allergic rhinitis, unspecified: Secondary | ICD-10-CM | POA: Diagnosis not present

## 2014-06-12 DIAGNOSIS — J309 Allergic rhinitis, unspecified: Secondary | ICD-10-CM | POA: Diagnosis not present

## 2014-06-15 DIAGNOSIS — J309 Allergic rhinitis, unspecified: Secondary | ICD-10-CM | POA: Diagnosis not present

## 2014-06-19 DIAGNOSIS — J309 Allergic rhinitis, unspecified: Secondary | ICD-10-CM | POA: Diagnosis not present

## 2014-06-22 DIAGNOSIS — L708 Other acne: Secondary | ICD-10-CM | POA: Diagnosis not present

## 2014-06-22 DIAGNOSIS — D2370 Other benign neoplasm of skin of unspecified lower limb, including hip: Secondary | ICD-10-CM | POA: Diagnosis not present

## 2014-06-22 DIAGNOSIS — J3089 Other allergic rhinitis: Secondary | ICD-10-CM | POA: Diagnosis not present

## 2014-06-22 DIAGNOSIS — L570 Actinic keratosis: Secondary | ICD-10-CM | POA: Diagnosis not present

## 2014-06-22 DIAGNOSIS — Z86018 Personal history of other benign neoplasm: Secondary | ICD-10-CM | POA: Diagnosis not present

## 2014-06-22 DIAGNOSIS — J301 Allergic rhinitis due to pollen: Secondary | ICD-10-CM | POA: Diagnosis not present

## 2014-06-22 DIAGNOSIS — L905 Scar conditions and fibrosis of skin: Secondary | ICD-10-CM | POA: Diagnosis not present

## 2014-06-27 DIAGNOSIS — Z23 Encounter for immunization: Secondary | ICD-10-CM | POA: Diagnosis not present

## 2014-07-03 DIAGNOSIS — Z1231 Encounter for screening mammogram for malignant neoplasm of breast: Secondary | ICD-10-CM | POA: Diagnosis not present

## 2014-07-03 DIAGNOSIS — J3089 Other allergic rhinitis: Secondary | ICD-10-CM | POA: Diagnosis not present

## 2014-07-03 DIAGNOSIS — Z803 Family history of malignant neoplasm of breast: Secondary | ICD-10-CM | POA: Diagnosis not present

## 2014-07-03 DIAGNOSIS — J301 Allergic rhinitis due to pollen: Secondary | ICD-10-CM | POA: Diagnosis not present

## 2014-07-11 DIAGNOSIS — J3081 Allergic rhinitis due to animal (cat) (dog) hair and dander: Secondary | ICD-10-CM | POA: Diagnosis not present

## 2014-07-11 DIAGNOSIS — J301 Allergic rhinitis due to pollen: Secondary | ICD-10-CM | POA: Diagnosis not present

## 2014-07-11 DIAGNOSIS — J3089 Other allergic rhinitis: Secondary | ICD-10-CM | POA: Diagnosis not present

## 2014-07-20 DIAGNOSIS — J3089 Other allergic rhinitis: Secondary | ICD-10-CM | POA: Diagnosis not present

## 2014-07-20 DIAGNOSIS — J301 Allergic rhinitis due to pollen: Secondary | ICD-10-CM | POA: Diagnosis not present

## 2014-07-31 DIAGNOSIS — J3089 Other allergic rhinitis: Secondary | ICD-10-CM | POA: Diagnosis not present

## 2014-07-31 DIAGNOSIS — J301 Allergic rhinitis due to pollen: Secondary | ICD-10-CM | POA: Diagnosis not present

## 2014-08-10 DIAGNOSIS — J301 Allergic rhinitis due to pollen: Secondary | ICD-10-CM | POA: Diagnosis not present

## 2014-08-21 DIAGNOSIS — J3089 Other allergic rhinitis: Secondary | ICD-10-CM | POA: Diagnosis not present

## 2014-08-21 DIAGNOSIS — J301 Allergic rhinitis due to pollen: Secondary | ICD-10-CM | POA: Diagnosis not present

## 2014-09-01 DIAGNOSIS — J3089 Other allergic rhinitis: Secondary | ICD-10-CM | POA: Diagnosis not present

## 2014-09-01 DIAGNOSIS — J301 Allergic rhinitis due to pollen: Secondary | ICD-10-CM | POA: Diagnosis not present

## 2014-09-11 DIAGNOSIS — J3089 Other allergic rhinitis: Secondary | ICD-10-CM | POA: Diagnosis not present

## 2014-09-11 DIAGNOSIS — J301 Allergic rhinitis due to pollen: Secondary | ICD-10-CM | POA: Diagnosis not present

## 2014-09-20 DIAGNOSIS — Z124 Encounter for screening for malignant neoplasm of cervix: Secondary | ICD-10-CM | POA: Diagnosis not present

## 2014-09-20 DIAGNOSIS — J3089 Other allergic rhinitis: Secondary | ICD-10-CM | POA: Diagnosis not present

## 2014-09-20 DIAGNOSIS — Z01419 Encounter for gynecological examination (general) (routine) without abnormal findings: Secondary | ICD-10-CM | POA: Diagnosis not present

## 2014-09-20 DIAGNOSIS — J301 Allergic rhinitis due to pollen: Secondary | ICD-10-CM | POA: Diagnosis not present

## 2014-09-29 DIAGNOSIS — M1990 Unspecified osteoarthritis, unspecified site: Secondary | ICD-10-CM | POA: Diagnosis not present

## 2014-09-29 DIAGNOSIS — E669 Obesity, unspecified: Secondary | ICD-10-CM | POA: Diagnosis not present

## 2014-09-29 DIAGNOSIS — E785 Hyperlipidemia, unspecified: Secondary | ICD-10-CM | POA: Diagnosis not present

## 2014-09-29 DIAGNOSIS — M810 Age-related osteoporosis without current pathological fracture: Secondary | ICD-10-CM | POA: Diagnosis not present

## 2014-09-29 DIAGNOSIS — Z6829 Body mass index (BMI) 29.0-29.9, adult: Secondary | ICD-10-CM | POA: Diagnosis not present

## 2014-09-29 DIAGNOSIS — E119 Type 2 diabetes mellitus without complications: Secondary | ICD-10-CM | POA: Diagnosis not present

## 2014-09-29 DIAGNOSIS — I10 Essential (primary) hypertension: Secondary | ICD-10-CM | POA: Diagnosis not present

## 2014-09-29 DIAGNOSIS — J301 Allergic rhinitis due to pollen: Secondary | ICD-10-CM | POA: Diagnosis not present

## 2014-09-29 DIAGNOSIS — Z1389 Encounter for screening for other disorder: Secondary | ICD-10-CM | POA: Diagnosis not present

## 2014-09-29 DIAGNOSIS — J3089 Other allergic rhinitis: Secondary | ICD-10-CM | POA: Diagnosis not present

## 2014-10-09 DIAGNOSIS — J301 Allergic rhinitis due to pollen: Secondary | ICD-10-CM | POA: Diagnosis not present

## 2014-10-09 DIAGNOSIS — J3089 Other allergic rhinitis: Secondary | ICD-10-CM | POA: Diagnosis not present

## 2014-10-20 DIAGNOSIS — J3089 Other allergic rhinitis: Secondary | ICD-10-CM | POA: Diagnosis not present

## 2014-10-20 DIAGNOSIS — J301 Allergic rhinitis due to pollen: Secondary | ICD-10-CM | POA: Diagnosis not present

## 2014-10-30 DIAGNOSIS — J301 Allergic rhinitis due to pollen: Secondary | ICD-10-CM | POA: Diagnosis not present

## 2014-10-30 DIAGNOSIS — J3089 Other allergic rhinitis: Secondary | ICD-10-CM | POA: Diagnosis not present

## 2014-11-09 DIAGNOSIS — J301 Allergic rhinitis due to pollen: Secondary | ICD-10-CM | POA: Diagnosis not present

## 2014-11-09 DIAGNOSIS — J3089 Other allergic rhinitis: Secondary | ICD-10-CM | POA: Diagnosis not present

## 2014-11-10 DIAGNOSIS — D485 Neoplasm of uncertain behavior of skin: Secondary | ICD-10-CM | POA: Diagnosis not present

## 2014-11-10 DIAGNOSIS — L570 Actinic keratosis: Secondary | ICD-10-CM | POA: Diagnosis not present

## 2014-11-10 DIAGNOSIS — D2370 Other benign neoplasm of skin of unspecified lower limb, including hip: Secondary | ICD-10-CM | POA: Diagnosis not present

## 2014-11-10 DIAGNOSIS — Z86018 Personal history of other benign neoplasm: Secondary | ICD-10-CM | POA: Diagnosis not present

## 2014-11-10 DIAGNOSIS — D225 Melanocytic nevi of trunk: Secondary | ICD-10-CM | POA: Diagnosis not present

## 2014-11-10 DIAGNOSIS — L219 Seborrheic dermatitis, unspecified: Secondary | ICD-10-CM | POA: Diagnosis not present

## 2014-11-10 DIAGNOSIS — Z808 Family history of malignant neoplasm of other organs or systems: Secondary | ICD-10-CM | POA: Diagnosis not present

## 2014-11-10 DIAGNOSIS — L719 Rosacea, unspecified: Secondary | ICD-10-CM | POA: Diagnosis not present

## 2014-11-14 DIAGNOSIS — L97811 Non-pressure chronic ulcer of other part of right lower leg limited to breakdown of skin: Secondary | ICD-10-CM | POA: Diagnosis not present

## 2014-11-20 DIAGNOSIS — J301 Allergic rhinitis due to pollen: Secondary | ICD-10-CM | POA: Diagnosis not present

## 2014-11-20 DIAGNOSIS — J3089 Other allergic rhinitis: Secondary | ICD-10-CM | POA: Diagnosis not present

## 2014-11-29 DIAGNOSIS — J3089 Other allergic rhinitis: Secondary | ICD-10-CM | POA: Diagnosis not present

## 2014-11-29 DIAGNOSIS — J301 Allergic rhinitis due to pollen: Secondary | ICD-10-CM | POA: Diagnosis not present

## 2014-12-06 DIAGNOSIS — J3089 Other allergic rhinitis: Secondary | ICD-10-CM | POA: Diagnosis not present

## 2014-12-06 DIAGNOSIS — J301 Allergic rhinitis due to pollen: Secondary | ICD-10-CM | POA: Diagnosis not present

## 2014-12-14 DIAGNOSIS — J301 Allergic rhinitis due to pollen: Secondary | ICD-10-CM | POA: Diagnosis not present

## 2014-12-14 DIAGNOSIS — J3089 Other allergic rhinitis: Secondary | ICD-10-CM | POA: Diagnosis not present

## 2014-12-22 DIAGNOSIS — J301 Allergic rhinitis due to pollen: Secondary | ICD-10-CM | POA: Diagnosis not present

## 2014-12-22 DIAGNOSIS — J3089 Other allergic rhinitis: Secondary | ICD-10-CM | POA: Diagnosis not present

## 2014-12-25 DIAGNOSIS — J301 Allergic rhinitis due to pollen: Secondary | ICD-10-CM | POA: Diagnosis not present

## 2014-12-25 DIAGNOSIS — J3089 Other allergic rhinitis: Secondary | ICD-10-CM | POA: Diagnosis not present

## 2014-12-29 DIAGNOSIS — M810 Age-related osteoporosis without current pathological fracture: Secondary | ICD-10-CM | POA: Diagnosis not present

## 2014-12-29 DIAGNOSIS — Z6828 Body mass index (BMI) 28.0-28.9, adult: Secondary | ICD-10-CM | POA: Diagnosis not present

## 2014-12-29 DIAGNOSIS — J301 Allergic rhinitis due to pollen: Secondary | ICD-10-CM | POA: Diagnosis not present

## 2014-12-29 DIAGNOSIS — E119 Type 2 diabetes mellitus without complications: Secondary | ICD-10-CM | POA: Diagnosis not present

## 2014-12-29 DIAGNOSIS — E785 Hyperlipidemia, unspecified: Secondary | ICD-10-CM | POA: Diagnosis not present

## 2014-12-29 DIAGNOSIS — M1990 Unspecified osteoarthritis, unspecified site: Secondary | ICD-10-CM | POA: Diagnosis not present

## 2014-12-29 DIAGNOSIS — I10 Essential (primary) hypertension: Secondary | ICD-10-CM | POA: Diagnosis not present

## 2014-12-29 DIAGNOSIS — J3089 Other allergic rhinitis: Secondary | ICD-10-CM | POA: Diagnosis not present

## 2015-01-01 DIAGNOSIS — J3089 Other allergic rhinitis: Secondary | ICD-10-CM | POA: Diagnosis not present

## 2015-01-01 DIAGNOSIS — J301 Allergic rhinitis due to pollen: Secondary | ICD-10-CM | POA: Diagnosis not present

## 2015-01-04 DIAGNOSIS — J301 Allergic rhinitis due to pollen: Secondary | ICD-10-CM | POA: Diagnosis not present

## 2015-01-04 DIAGNOSIS — J3089 Other allergic rhinitis: Secondary | ICD-10-CM | POA: Diagnosis not present

## 2015-01-08 DIAGNOSIS — J301 Allergic rhinitis due to pollen: Secondary | ICD-10-CM | POA: Diagnosis not present

## 2015-01-08 DIAGNOSIS — J3089 Other allergic rhinitis: Secondary | ICD-10-CM | POA: Diagnosis not present

## 2015-01-10 DIAGNOSIS — J301 Allergic rhinitis due to pollen: Secondary | ICD-10-CM | POA: Diagnosis not present

## 2015-01-10 DIAGNOSIS — J3089 Other allergic rhinitis: Secondary | ICD-10-CM | POA: Diagnosis not present

## 2015-01-19 DIAGNOSIS — J301 Allergic rhinitis due to pollen: Secondary | ICD-10-CM | POA: Diagnosis not present

## 2015-01-19 DIAGNOSIS — J3089 Other allergic rhinitis: Secondary | ICD-10-CM | POA: Diagnosis not present

## 2015-01-26 DIAGNOSIS — J3089 Other allergic rhinitis: Secondary | ICD-10-CM | POA: Diagnosis not present

## 2015-01-26 DIAGNOSIS — J301 Allergic rhinitis due to pollen: Secondary | ICD-10-CM | POA: Diagnosis not present

## 2015-02-01 DIAGNOSIS — H5010 Unspecified exotropia: Secondary | ICD-10-CM | POA: Diagnosis not present

## 2015-02-01 DIAGNOSIS — H2513 Age-related nuclear cataract, bilateral: Secondary | ICD-10-CM | POA: Diagnosis not present

## 2015-02-01 DIAGNOSIS — E119 Type 2 diabetes mellitus without complications: Secondary | ICD-10-CM | POA: Diagnosis not present

## 2015-02-01 DIAGNOSIS — H5203 Hypermetropia, bilateral: Secondary | ICD-10-CM | POA: Diagnosis not present

## 2015-02-05 DIAGNOSIS — J3089 Other allergic rhinitis: Secondary | ICD-10-CM | POA: Diagnosis not present

## 2015-02-05 DIAGNOSIS — J301 Allergic rhinitis due to pollen: Secondary | ICD-10-CM | POA: Diagnosis not present

## 2015-02-15 DIAGNOSIS — J301 Allergic rhinitis due to pollen: Secondary | ICD-10-CM | POA: Diagnosis not present

## 2015-02-15 DIAGNOSIS — J3089 Other allergic rhinitis: Secondary | ICD-10-CM | POA: Diagnosis not present

## 2015-02-23 DIAGNOSIS — J3089 Other allergic rhinitis: Secondary | ICD-10-CM | POA: Diagnosis not present

## 2015-02-23 DIAGNOSIS — J301 Allergic rhinitis due to pollen: Secondary | ICD-10-CM | POA: Diagnosis not present

## 2015-03-06 DIAGNOSIS — J3089 Other allergic rhinitis: Secondary | ICD-10-CM | POA: Diagnosis not present

## 2015-03-06 DIAGNOSIS — J301 Allergic rhinitis due to pollen: Secondary | ICD-10-CM | POA: Diagnosis not present

## 2015-03-16 DIAGNOSIS — J3089 Other allergic rhinitis: Secondary | ICD-10-CM | POA: Diagnosis not present

## 2015-03-16 DIAGNOSIS — J301 Allergic rhinitis due to pollen: Secondary | ICD-10-CM | POA: Diagnosis not present

## 2015-03-27 DIAGNOSIS — J3089 Other allergic rhinitis: Secondary | ICD-10-CM | POA: Diagnosis not present

## 2015-03-27 DIAGNOSIS — J301 Allergic rhinitis due to pollen: Secondary | ICD-10-CM | POA: Diagnosis not present

## 2015-04-06 DIAGNOSIS — J301 Allergic rhinitis due to pollen: Secondary | ICD-10-CM | POA: Diagnosis not present

## 2015-04-06 DIAGNOSIS — J3089 Other allergic rhinitis: Secondary | ICD-10-CM | POA: Diagnosis not present

## 2015-04-16 DIAGNOSIS — J3089 Other allergic rhinitis: Secondary | ICD-10-CM | POA: Diagnosis not present

## 2015-04-16 DIAGNOSIS — J301 Allergic rhinitis due to pollen: Secondary | ICD-10-CM | POA: Diagnosis not present

## 2015-04-27 DIAGNOSIS — E785 Hyperlipidemia, unspecified: Secondary | ICD-10-CM | POA: Diagnosis not present

## 2015-04-27 DIAGNOSIS — I10 Essential (primary) hypertension: Secondary | ICD-10-CM | POA: Diagnosis not present

## 2015-04-27 DIAGNOSIS — J301 Allergic rhinitis due to pollen: Secondary | ICD-10-CM | POA: Diagnosis not present

## 2015-04-27 DIAGNOSIS — M810 Age-related osteoporosis without current pathological fracture: Secondary | ICD-10-CM | POA: Diagnosis not present

## 2015-04-27 DIAGNOSIS — J3089 Other allergic rhinitis: Secondary | ICD-10-CM | POA: Diagnosis not present

## 2015-04-27 DIAGNOSIS — E119 Type 2 diabetes mellitus without complications: Secondary | ICD-10-CM | POA: Diagnosis not present

## 2015-05-02 DIAGNOSIS — E669 Obesity, unspecified: Secondary | ICD-10-CM | POA: Diagnosis not present

## 2015-05-02 DIAGNOSIS — E119 Type 2 diabetes mellitus without complications: Secondary | ICD-10-CM | POA: Diagnosis not present

## 2015-05-02 DIAGNOSIS — M1990 Unspecified osteoarthritis, unspecified site: Secondary | ICD-10-CM | POA: Diagnosis not present

## 2015-05-02 DIAGNOSIS — Z6828 Body mass index (BMI) 28.0-28.9, adult: Secondary | ICD-10-CM | POA: Diagnosis not present

## 2015-05-02 DIAGNOSIS — I10 Essential (primary) hypertension: Secondary | ICD-10-CM | POA: Diagnosis not present

## 2015-05-02 DIAGNOSIS — E785 Hyperlipidemia, unspecified: Secondary | ICD-10-CM | POA: Diagnosis not present

## 2015-05-02 DIAGNOSIS — Z0000 Encounter for general adult medical examination without abnormal findings: Secondary | ICD-10-CM | POA: Diagnosis not present

## 2015-05-02 DIAGNOSIS — M810 Age-related osteoporosis without current pathological fracture: Secondary | ICD-10-CM | POA: Diagnosis not present

## 2015-05-07 DIAGNOSIS — J3089 Other allergic rhinitis: Secondary | ICD-10-CM | POA: Diagnosis not present

## 2015-05-07 DIAGNOSIS — J301 Allergic rhinitis due to pollen: Secondary | ICD-10-CM | POA: Diagnosis not present

## 2015-05-07 DIAGNOSIS — Z1212 Encounter for screening for malignant neoplasm of rectum: Secondary | ICD-10-CM | POA: Diagnosis not present

## 2015-05-14 DIAGNOSIS — J3089 Other allergic rhinitis: Secondary | ICD-10-CM | POA: Diagnosis not present

## 2015-05-14 DIAGNOSIS — J301 Allergic rhinitis due to pollen: Secondary | ICD-10-CM | POA: Diagnosis not present

## 2015-05-14 DIAGNOSIS — J3081 Allergic rhinitis due to animal (cat) (dog) hair and dander: Secondary | ICD-10-CM | POA: Diagnosis not present

## 2015-05-14 DIAGNOSIS — T63441D Toxic effect of venom of bees, accidental (unintentional), subsequent encounter: Secondary | ICD-10-CM | POA: Diagnosis not present

## 2015-05-18 DIAGNOSIS — J301 Allergic rhinitis due to pollen: Secondary | ICD-10-CM | POA: Diagnosis not present

## 2015-05-18 DIAGNOSIS — J3089 Other allergic rhinitis: Secondary | ICD-10-CM | POA: Diagnosis not present

## 2015-05-29 DIAGNOSIS — J3089 Other allergic rhinitis: Secondary | ICD-10-CM | POA: Diagnosis not present

## 2015-05-29 DIAGNOSIS — J301 Allergic rhinitis due to pollen: Secondary | ICD-10-CM | POA: Diagnosis not present

## 2015-06-11 DIAGNOSIS — J3089 Other allergic rhinitis: Secondary | ICD-10-CM | POA: Diagnosis not present

## 2015-06-11 DIAGNOSIS — J301 Allergic rhinitis due to pollen: Secondary | ICD-10-CM | POA: Diagnosis not present

## 2015-06-16 DIAGNOSIS — Z23 Encounter for immunization: Secondary | ICD-10-CM | POA: Diagnosis not present

## 2015-06-22 DIAGNOSIS — J301 Allergic rhinitis due to pollen: Secondary | ICD-10-CM | POA: Diagnosis not present

## 2015-06-22 DIAGNOSIS — J3089 Other allergic rhinitis: Secondary | ICD-10-CM | POA: Diagnosis not present

## 2015-07-02 DIAGNOSIS — J301 Allergic rhinitis due to pollen: Secondary | ICD-10-CM | POA: Diagnosis not present

## 2015-07-02 DIAGNOSIS — J3089 Other allergic rhinitis: Secondary | ICD-10-CM | POA: Diagnosis not present

## 2015-07-05 DIAGNOSIS — Z1231 Encounter for screening mammogram for malignant neoplasm of breast: Secondary | ICD-10-CM | POA: Diagnosis not present

## 2015-07-10 DIAGNOSIS — J3089 Other allergic rhinitis: Secondary | ICD-10-CM | POA: Diagnosis not present

## 2015-07-10 DIAGNOSIS — J301 Allergic rhinitis due to pollen: Secondary | ICD-10-CM | POA: Diagnosis not present

## 2015-07-20 DIAGNOSIS — J301 Allergic rhinitis due to pollen: Secondary | ICD-10-CM | POA: Diagnosis not present

## 2015-07-20 DIAGNOSIS — J3089 Other allergic rhinitis: Secondary | ICD-10-CM | POA: Diagnosis not present

## 2015-07-24 DIAGNOSIS — J301 Allergic rhinitis due to pollen: Secondary | ICD-10-CM | POA: Diagnosis not present

## 2015-07-24 DIAGNOSIS — J3089 Other allergic rhinitis: Secondary | ICD-10-CM | POA: Diagnosis not present

## 2015-07-31 DIAGNOSIS — J3089 Other allergic rhinitis: Secondary | ICD-10-CM | POA: Diagnosis not present

## 2015-07-31 DIAGNOSIS — J301 Allergic rhinitis due to pollen: Secondary | ICD-10-CM | POA: Diagnosis not present

## 2015-08-03 DIAGNOSIS — J301 Allergic rhinitis due to pollen: Secondary | ICD-10-CM | POA: Diagnosis not present

## 2015-08-03 DIAGNOSIS — J3089 Other allergic rhinitis: Secondary | ICD-10-CM | POA: Diagnosis not present

## 2015-08-06 DIAGNOSIS — J301 Allergic rhinitis due to pollen: Secondary | ICD-10-CM | POA: Diagnosis not present

## 2015-08-06 DIAGNOSIS — J3089 Other allergic rhinitis: Secondary | ICD-10-CM | POA: Diagnosis not present

## 2015-08-10 DIAGNOSIS — J301 Allergic rhinitis due to pollen: Secondary | ICD-10-CM | POA: Diagnosis not present

## 2015-08-10 DIAGNOSIS — J3089 Other allergic rhinitis: Secondary | ICD-10-CM | POA: Diagnosis not present

## 2015-08-15 DIAGNOSIS — J301 Allergic rhinitis due to pollen: Secondary | ICD-10-CM | POA: Diagnosis not present

## 2015-08-15 DIAGNOSIS — J3089 Other allergic rhinitis: Secondary | ICD-10-CM | POA: Diagnosis not present

## 2015-08-27 DIAGNOSIS — J3089 Other allergic rhinitis: Secondary | ICD-10-CM | POA: Diagnosis not present

## 2015-08-27 DIAGNOSIS — J301 Allergic rhinitis due to pollen: Secondary | ICD-10-CM | POA: Diagnosis not present

## 2015-09-05 ENCOUNTER — Encounter (HOSPITAL_BASED_OUTPATIENT_CLINIC_OR_DEPARTMENT_OTHER): Payer: Self-pay | Admitting: *Deleted

## 2015-09-05 ENCOUNTER — Emergency Department (HOSPITAL_BASED_OUTPATIENT_CLINIC_OR_DEPARTMENT_OTHER)
Admission: EM | Admit: 2015-09-05 | Discharge: 2015-09-05 | Disposition: A | Discharge status: Home / Self Care | Payer: Medicare Other | Attending: Emergency Medicine | Admitting: Emergency Medicine

## 2015-09-05 DIAGNOSIS — Z79899 Other long term (current) drug therapy: Secondary | ICD-10-CM | POA: Diagnosis not present

## 2015-09-05 DIAGNOSIS — Y9289 Other specified places as the place of occurrence of the external cause: Secondary | ICD-10-CM | POA: Insufficient documentation

## 2015-09-05 DIAGNOSIS — Y9389 Activity, other specified: Secondary | ICD-10-CM | POA: Diagnosis not present

## 2015-09-05 DIAGNOSIS — Z7982 Long term (current) use of aspirin: Secondary | ICD-10-CM | POA: Diagnosis not present

## 2015-09-05 DIAGNOSIS — J3089 Other allergic rhinitis: Secondary | ICD-10-CM | POA: Diagnosis not present

## 2015-09-05 DIAGNOSIS — Z7984 Long term (current) use of oral hypoglycemic drugs: Secondary | ICD-10-CM | POA: Insufficient documentation

## 2015-09-05 DIAGNOSIS — T383X1A Poisoning by insulin and oral hypoglycemic [antidiabetic] drugs, accidental (unintentional), initial encounter: Secondary | ICD-10-CM | POA: Diagnosis not present

## 2015-09-05 DIAGNOSIS — Z7951 Long term (current) use of inhaled steroids: Secondary | ICD-10-CM | POA: Insufficient documentation

## 2015-09-05 DIAGNOSIS — M8580 Other specified disorders of bone density and structure, unspecified site: Secondary | ICD-10-CM | POA: Diagnosis not present

## 2015-09-05 DIAGNOSIS — T50901A Poisoning by unspecified drugs, medicaments and biological substances, accidental (unintentional), initial encounter: Secondary | ICD-10-CM | POA: Diagnosis not present

## 2015-09-05 DIAGNOSIS — E119 Type 2 diabetes mellitus without complications: Secondary | ICD-10-CM | POA: Insufficient documentation

## 2015-09-05 DIAGNOSIS — I10 Essential (primary) hypertension: Secondary | ICD-10-CM | POA: Diagnosis not present

## 2015-09-05 DIAGNOSIS — Y998 Other external cause status: Secondary | ICD-10-CM | POA: Diagnosis not present

## 2015-09-05 DIAGNOSIS — J301 Allergic rhinitis due to pollen: Secondary | ICD-10-CM | POA: Diagnosis not present

## 2015-09-05 LAB — CBG MONITORING, ED: Glucose-Capillary: 265 mg/dL — ABNORMAL HIGH (ref 65–99)

## 2015-09-05 LAB — COMPREHENSIVE METABOLIC PANEL
ALBUMIN: 4.2 g/dL (ref 3.5–5.0)
ALK PHOS: 51 U/L (ref 38–126)
ALT: 24 U/L (ref 14–54)
ANION GAP: 10 (ref 5–15)
AST: 24 U/L (ref 15–41)
BUN: 22 mg/dL — ABNORMAL HIGH (ref 6–20)
CO2: 26 mmol/L (ref 22–32)
Calcium: 9.7 mg/dL (ref 8.9–10.3)
Chloride: 103 mmol/L (ref 101–111)
Creatinine, Ser: 0.69 mg/dL (ref 0.44–1.00)
GFR calc Af Amer: >60 mL/min (ref >60)
GFR calc non Af Amer: >60 mL/min (ref >60)
GLUCOSE: 262 mg/dL — AB (ref 65–99)
POTASSIUM: 3.4 mmol/L — AB (ref 3.5–5.1)
SODIUM: 139 mmol/L (ref 135–145)
Total Bilirubin: 0.6 mg/dL (ref 0.3–1.2)
Total Protein: 7.4 g/dL (ref 6.5–8.1)

## 2015-09-05 LAB — CBC WITH DIFFERENTIAL/PLATELET
Basophils Absolute: 0 10*3/uL (ref 0.0–0.1)
Basophils Relative: 0 %
Eosinophils Absolute: 0.1 10*3/uL (ref 0.0–0.7)
Eosinophils Relative: 1 %
HEMATOCRIT: 44.2 % (ref 36.0–46.0)
Hemoglobin: 14.5 g/dL (ref 12.0–15.0)
LYMPHS PCT: 29 %
Lymphs Abs: 2.1 10*3/uL (ref 0.7–4.0)
MCH: 30.4 pg (ref 26.0–34.0)
MCHC: 32.8 g/dL (ref 30.0–36.0)
MCV: 92.7 fL (ref 78.0–100.0)
MONO ABS: 0.6 10*3/uL (ref 0.1–1.0)
MONOS PCT: 8 %
NEUTROS ABS: 4.5 10*3/uL (ref 1.7–7.7)
Neutrophils Relative %: 62 %
Platelets: 286 10*3/uL (ref 150–400)
RBC: 4.77 MIL/uL (ref 3.87–5.11)
RDW: 12.3 % (ref 11.5–15.5)
WBC: 7.2 10*3/uL (ref 4.0–10.5)

## 2015-09-05 NOTE — ED Provider Notes (Signed)
CSN: [id number]     Arrival date & time 09/05/15  0718 History   First MD Initiated Contact with Patient 09/05/15 0730     Chief Complaint  Patient presents with  . Drug Overdose     (Consider location/radiation/quality/duration/timing/severity/associated sxs/prior Treatment) Patient is a 69 y.o. female presenting with Overdose. The history is provided by the patient.  Drug Overdose This is a new problem. The current episode started 1 to 2 hours ago (Accidentally took 400 mg of invokana this morning because of miscounted tablets. When she realized her air or she did not take any other anti-hyperglycemics in her regimen). The problem occurs constantly. The problem has not changed since onset.Pertinent negatives include no chest pain, no abdominal pain and no shortness of breath. Nothing aggravates the symptoms. Nothing relieves the symptoms. She has tried nothing for the symptoms.    Past Medical History  Diagnosis Date  . Diabetes mellitus without complication ([place])   . Hypertension   . Pollen allergies   . Osteopenia    Past Surgical History  Procedure Laterality Date  . Cesarean section  1981, 1987, 1989  . Shoulder open rotator cuff repair  2014   Family History  Problem Relation Age of Onset  . Colon cancer Neg Hx   . Esophageal cancer Neg Hx   . Rectal cancer Neg Hx   . Stomach cancer Neg Hx    Social History  Substance Use Topics  . Smoking status: Never Smoker   . Smokeless tobacco: Never Used  . Alcohol Use: 0.6 oz/week    1 Glasses of wine per week   OB History    No data available     Review of Systems  Respiratory: Negative for shortness of breath.   Cardiovascular: Negative for chest pain.  Gastrointestinal: Negative for abdominal pain.  All other systems reviewed and are negative.     Allergies  Erythromycin  Home Medications   Prior to Admission medications   Medication Sig Start Date End Date Taking? Authorizing Provider  ascorbic acid  (VITAMIN C) 1000 MG tablet Take 1,000 mg by mouth daily.    Historical Provider, MD  aspirin 81 MG tablet Take 81 mg by mouth daily.    Historical Provider, MD  atenolol-chlorthalidone (TENORETIC) 50-25 MG per tablet Take 0.5 tablets by mouth daily.    Historical Provider, MD  atorvastatin (LIPITOR) 20 MG tablet Take 20 mg by mouth daily.    Historical Provider, MD  beta carotene w/minerals (OCUVITE) tablet Take 1 tablet by mouth daily.    Historical Provider, MD  calcium carbonate 1250 MG capsule Take 1,250 mg by mouth 2 (two) times daily with a meal.    Historical Provider, MD  Canagliflozin (INVOKANA) 100 MG TABS Take 100 mg by mouth daily.    Historical Provider, MD  carboxymethylcellulose (REFRESH PLUS) 0.5 % SOLN 1 drop 2 (two) times daily as needed.    Historical Provider, MD  desloratadine (CLARINEX) 5 MG tablet Take 5 mg by mouth daily.    Historical Provider, MD  Exenatide (BYDUREON) 2 MG SUSR Inject into the skin once a week.    Historical Provider, MD  fish oil-omega-3 fatty acids 1000 MG capsule Take 1,200 mg by mouth daily.    Historical Provider, MD  Flaxseed, Linseed, (FLAXSEED OIL PO) Take 1,200 mg by mouth.    Historical Provider, MD  fluticasone (FLONASE) 50 MCG/ACT nasal spray Place 2 sprays into the nose daily.    Historical Provider, MD  glipiZIDE (  GLUCOTROL) 10 MG tablet Take 10 mg by mouth 2 (two) times daily before a meal.    Historical Provider, MD  metFORMIN (GLUCOPHAGE) 500 MG tablet Take 500 mg by mouth 2 (two) times daily with a meal.    Historical Provider, MD  Multiple Vitamin (MULTIVITAMIN) tablet Take 1 tablet by mouth daily.    Historical Provider, MD  NON FORMULARY Place 1 drop into both eyes 2 (two) times daily. ellestat 0.5%    Historical Provider, MD   BP 147/67 mmHg  Pulse 90  Temp(Src) 98.4 F (36.9 C) (Oral)  Resp 16  Ht 5\' 2"  (1.575 m)  Wt 155 lb (70.308 kg)  BMI 28.34 kg/m2  SpO2 98% Physical Exam  Constitutional: She is oriented to person,  place, and time. She appears well-developed and well-nourished. No distress.  HENT:  Head: Normocephalic.  Eyes: Conjunctivae are normal.  Neck: Neck supple. No tracheal deviation present.  Cardiovascular: Normal rate and regular rhythm.   Pulmonary/Chest: Effort normal. No respiratory distress.  Abdominal: Soft. She exhibits no distension.  Neurological: She is alert and oriented to person, place, and time.  Skin: Skin is warm and dry.  Psychiatric: She has a normal mood and affect.    ED Course  Procedures (including critical care time) Labs Review Labs Reviewed  COMPREHENSIVE METABOLIC PANEL - Abnormal; Notable for the following:    Potassium 3.4 (*)    Glucose, Bld 262 (*)    BUN 22 (*)    All other components within normal limits  CBG MONITORING, ED - Abnormal; Notable for the following:    Glucose-Capillary 265 (*)    All other components within normal limits  CBC WITH DIFFERENTIAL/PLATELET    Imaging Review No results found. I have personally reviewed and evaluated these images and lab results as part of my medical decision-making.   EKG Interpretation None      MDM   Final diagnoses:  Drug ingestion, accidental or unintentional, initial encounter    69 y.o. female presents with an intentional overdose of SGLT2 inhibitor medication. Poison control recommended the patient come here for evaluation. Baseline screening labs are ordered. Overdose and toxicity has not been reported with this agent. Due to its mechanism of action it is unlikely that in isolation it would cause any harm to the patient. Agree with holding glipizide, metformin, and exenatide until able to establish that the peak effect of the drug has passed.  I discussed this with poison control who agrees with outpatient monitoring and management and will follow blood sugars throughout the day with the patient.  Leo Grosser, MD 09/05/15 [phone]

## 2015-09-05 NOTE — ED Notes (Signed)
Pt amb to room 1 with quick steady gait in nad. Pt smiling, states she has been so excited about the holidays and busy with her schedule, that she accidentally took too many of her medications this morning. Pt called poison control who told her to come here asap. Pt is talking on her cell phone cancelling her appointments for the day. Pt requested to hang up her cell phone and change into gown. Pt reports mixing up her medications this morning, and as she was taking them noticed that she had taken four of her invocana 100mg , had fifth tablet in her mouth, which she spit into the garbage. Pt states she also took: fish oil one tablet 1200 mg, flaxseed oil capsule, calcium tablet, mvi with minerals one tablet, baby asa 81mg , loratidine one tablet, when she realized what she had done, pt did not take her remaining medications which are: glucotrol, metformin, bidurion weekly injection. Pt states she feels fine, denies any c/o at this time. fsbs this am was 250 after breakfast at 0630.

## 2015-09-05 NOTE — Discharge Instructions (Signed)

## 2015-09-13 DIAGNOSIS — J301 Allergic rhinitis due to pollen: Secondary | ICD-10-CM | POA: Diagnosis not present

## 2015-09-13 DIAGNOSIS — J3089 Other allergic rhinitis: Secondary | ICD-10-CM | POA: Diagnosis not present

## 2015-09-25 DIAGNOSIS — J301 Allergic rhinitis due to pollen: Secondary | ICD-10-CM | POA: Diagnosis not present

## 2015-09-25 DIAGNOSIS — J3089 Other allergic rhinitis: Secondary | ICD-10-CM | POA: Diagnosis not present

## 2015-10-08 DIAGNOSIS — J3089 Other allergic rhinitis: Secondary | ICD-10-CM | POA: Diagnosis not present

## 2015-10-08 DIAGNOSIS — J301 Allergic rhinitis due to pollen: Secondary | ICD-10-CM | POA: Diagnosis not present

## 2015-10-22 DIAGNOSIS — J301 Allergic rhinitis due to pollen: Secondary | ICD-10-CM | POA: Diagnosis not present

## 2015-10-22 DIAGNOSIS — J3089 Other allergic rhinitis: Secondary | ICD-10-CM | POA: Diagnosis not present

## 2015-10-23 DIAGNOSIS — Z01419 Encounter for gynecological examination (general) (routine) without abnormal findings: Secondary | ICD-10-CM | POA: Diagnosis not present

## 2015-10-23 DIAGNOSIS — Z124 Encounter for screening for malignant neoplasm of cervix: Secondary | ICD-10-CM | POA: Diagnosis not present

## 2015-11-02 DIAGNOSIS — E119 Type 2 diabetes mellitus without complications: Secondary | ICD-10-CM | POA: Diagnosis not present

## 2015-11-02 DIAGNOSIS — R197 Diarrhea, unspecified: Secondary | ICD-10-CM | POA: Diagnosis not present

## 2015-11-02 DIAGNOSIS — E663 Overweight: Secondary | ICD-10-CM | POA: Diagnosis not present

## 2015-11-02 DIAGNOSIS — M810 Age-related osteoporosis without current pathological fracture: Secondary | ICD-10-CM | POA: Diagnosis not present

## 2015-11-02 DIAGNOSIS — Z1389 Encounter for screening for other disorder: Secondary | ICD-10-CM | POA: Diagnosis not present

## 2015-11-02 DIAGNOSIS — E784 Other hyperlipidemia: Secondary | ICD-10-CM | POA: Diagnosis not present

## 2015-11-02 DIAGNOSIS — Z6828 Body mass index (BMI) 28.0-28.9, adult: Secondary | ICD-10-CM | POA: Diagnosis not present

## 2015-11-02 DIAGNOSIS — J302 Other seasonal allergic rhinitis: Secondary | ICD-10-CM | POA: Diagnosis not present

## 2015-11-02 DIAGNOSIS — M1990 Unspecified osteoarthritis, unspecified site: Secondary | ICD-10-CM | POA: Diagnosis not present

## 2015-11-02 DIAGNOSIS — I10 Essential (primary) hypertension: Secondary | ICD-10-CM | POA: Diagnosis not present

## 2015-11-02 DIAGNOSIS — Z0000 Encounter for general adult medical examination without abnormal findings: Secondary | ICD-10-CM | POA: Diagnosis not present

## 2015-11-05 DIAGNOSIS — J301 Allergic rhinitis due to pollen: Secondary | ICD-10-CM | POA: Diagnosis not present

## 2015-11-05 DIAGNOSIS — J3089 Other allergic rhinitis: Secondary | ICD-10-CM | POA: Diagnosis not present

## 2015-11-19 DIAGNOSIS — J301 Allergic rhinitis due to pollen: Secondary | ICD-10-CM | POA: Diagnosis not present

## 2015-11-19 DIAGNOSIS — J3089 Other allergic rhinitis: Secondary | ICD-10-CM | POA: Diagnosis not present

## 2015-12-03 DIAGNOSIS — J301 Allergic rhinitis due to pollen: Secondary | ICD-10-CM | POA: Diagnosis not present

## 2015-12-03 DIAGNOSIS — J3089 Other allergic rhinitis: Secondary | ICD-10-CM | POA: Diagnosis not present

## 2015-12-17 DIAGNOSIS — J301 Allergic rhinitis due to pollen: Secondary | ICD-10-CM | POA: Diagnosis not present

## 2015-12-17 DIAGNOSIS — J3089 Other allergic rhinitis: Secondary | ICD-10-CM | POA: Diagnosis not present

## 2015-12-31 DIAGNOSIS — J301 Allergic rhinitis due to pollen: Secondary | ICD-10-CM | POA: Diagnosis not present

## 2015-12-31 DIAGNOSIS — J3089 Other allergic rhinitis: Secondary | ICD-10-CM | POA: Diagnosis not present

## 2016-01-14 DIAGNOSIS — J3089 Other allergic rhinitis: Secondary | ICD-10-CM | POA: Diagnosis not present

## 2016-01-14 DIAGNOSIS — J301 Allergic rhinitis due to pollen: Secondary | ICD-10-CM | POA: Diagnosis not present

## 2016-01-28 DIAGNOSIS — J3089 Other allergic rhinitis: Secondary | ICD-10-CM | POA: Diagnosis not present

## 2016-01-28 DIAGNOSIS — J301 Allergic rhinitis due to pollen: Secondary | ICD-10-CM | POA: Diagnosis not present

## 2016-02-01 DIAGNOSIS — Z0100 Encounter for examination of eyes and vision without abnormal findings: Secondary | ICD-10-CM | POA: Diagnosis not present

## 2016-02-01 DIAGNOSIS — H353131 Nonexudative age-related macular degeneration, bilateral, early dry stage: Secondary | ICD-10-CM | POA: Diagnosis not present

## 2016-02-01 DIAGNOSIS — H25813 Combined forms of age-related cataract, bilateral: Secondary | ICD-10-CM | POA: Diagnosis not present

## 2016-02-01 DIAGNOSIS — E119 Type 2 diabetes mellitus without complications: Secondary | ICD-10-CM | POA: Diagnosis not present

## 2016-02-11 DIAGNOSIS — J3089 Other allergic rhinitis: Secondary | ICD-10-CM | POA: Diagnosis not present

## 2016-02-11 DIAGNOSIS — J301 Allergic rhinitis due to pollen: Secondary | ICD-10-CM | POA: Diagnosis not present

## 2016-02-25 DIAGNOSIS — J3089 Other allergic rhinitis: Secondary | ICD-10-CM | POA: Diagnosis not present

## 2016-02-25 DIAGNOSIS — J301 Allergic rhinitis due to pollen: Secondary | ICD-10-CM | POA: Diagnosis not present

## 2016-02-28 DIAGNOSIS — J301 Allergic rhinitis due to pollen: Secondary | ICD-10-CM | POA: Diagnosis not present

## 2016-02-28 DIAGNOSIS — J3089 Other allergic rhinitis: Secondary | ICD-10-CM | POA: Diagnosis not present

## 2016-03-10 DIAGNOSIS — J301 Allergic rhinitis due to pollen: Secondary | ICD-10-CM | POA: Diagnosis not present

## 2016-03-10 DIAGNOSIS — J3089 Other allergic rhinitis: Secondary | ICD-10-CM | POA: Diagnosis not present

## 2016-03-26 DIAGNOSIS — J301 Allergic rhinitis due to pollen: Secondary | ICD-10-CM | POA: Diagnosis not present

## 2016-03-26 DIAGNOSIS — J3089 Other allergic rhinitis: Secondary | ICD-10-CM | POA: Diagnosis not present

## 2016-04-02 DIAGNOSIS — E119 Type 2 diabetes mellitus without complications: Secondary | ICD-10-CM | POA: Diagnosis not present

## 2016-04-02 DIAGNOSIS — M1990 Unspecified osteoarthritis, unspecified site: Secondary | ICD-10-CM | POA: Diagnosis not present

## 2016-04-02 DIAGNOSIS — Z6828 Body mass index (BMI) 28.0-28.9, adult: Secondary | ICD-10-CM | POA: Diagnosis not present

## 2016-04-02 DIAGNOSIS — M810 Age-related osteoporosis without current pathological fracture: Secondary | ICD-10-CM | POA: Diagnosis not present

## 2016-04-02 DIAGNOSIS — R197 Diarrhea, unspecified: Secondary | ICD-10-CM | POA: Diagnosis not present

## 2016-04-02 DIAGNOSIS — E663 Overweight: Secondary | ICD-10-CM | POA: Diagnosis not present

## 2016-04-02 DIAGNOSIS — J302 Other seasonal allergic rhinitis: Secondary | ICD-10-CM | POA: Diagnosis not present

## 2016-04-02 DIAGNOSIS — I10 Essential (primary) hypertension: Secondary | ICD-10-CM | POA: Diagnosis not present

## 2016-04-02 DIAGNOSIS — E785 Hyperlipidemia, unspecified: Secondary | ICD-10-CM | POA: Diagnosis not present

## 2016-04-09 DIAGNOSIS — J3089 Other allergic rhinitis: Secondary | ICD-10-CM | POA: Diagnosis not present

## 2016-04-09 DIAGNOSIS — J301 Allergic rhinitis due to pollen: Secondary | ICD-10-CM | POA: Diagnosis not present

## 2016-04-11 DIAGNOSIS — J301 Allergic rhinitis due to pollen: Secondary | ICD-10-CM | POA: Diagnosis not present

## 2016-04-11 DIAGNOSIS — J3089 Other allergic rhinitis: Secondary | ICD-10-CM | POA: Diagnosis not present

## 2016-04-21 DIAGNOSIS — J301 Allergic rhinitis due to pollen: Secondary | ICD-10-CM | POA: Diagnosis not present

## 2016-04-21 DIAGNOSIS — J3089 Other allergic rhinitis: Secondary | ICD-10-CM | POA: Diagnosis not present

## 2016-04-24 DIAGNOSIS — J3089 Other allergic rhinitis: Secondary | ICD-10-CM | POA: Diagnosis not present

## 2016-04-24 DIAGNOSIS — J301 Allergic rhinitis due to pollen: Secondary | ICD-10-CM | POA: Diagnosis not present

## 2016-04-28 DIAGNOSIS — J301 Allergic rhinitis due to pollen: Secondary | ICD-10-CM | POA: Diagnosis not present

## 2016-04-28 DIAGNOSIS — J3089 Other allergic rhinitis: Secondary | ICD-10-CM | POA: Diagnosis not present

## 2016-05-01 DIAGNOSIS — J301 Allergic rhinitis due to pollen: Secondary | ICD-10-CM | POA: Diagnosis not present

## 2016-05-01 DIAGNOSIS — J3089 Other allergic rhinitis: Secondary | ICD-10-CM | POA: Diagnosis not present

## 2016-05-12 DIAGNOSIS — J3089 Other allergic rhinitis: Secondary | ICD-10-CM | POA: Diagnosis not present

## 2016-05-12 DIAGNOSIS — J301 Allergic rhinitis due to pollen: Secondary | ICD-10-CM | POA: Diagnosis not present

## 2016-05-13 DIAGNOSIS — J3089 Other allergic rhinitis: Secondary | ICD-10-CM | POA: Diagnosis not present

## 2016-05-13 DIAGNOSIS — T63441D Toxic effect of venom of bees, accidental (unintentional), subsequent encounter: Secondary | ICD-10-CM | POA: Diagnosis not present

## 2016-05-13 DIAGNOSIS — J301 Allergic rhinitis due to pollen: Secondary | ICD-10-CM | POA: Diagnosis not present

## 2016-05-13 DIAGNOSIS — J3081 Allergic rhinitis due to animal (cat) (dog) hair and dander: Secondary | ICD-10-CM | POA: Diagnosis not present

## 2016-05-27 DIAGNOSIS — J301 Allergic rhinitis due to pollen: Secondary | ICD-10-CM | POA: Diagnosis not present

## 2016-05-27 DIAGNOSIS — J3089 Other allergic rhinitis: Secondary | ICD-10-CM | POA: Diagnosis not present

## 2016-06-05 DIAGNOSIS — Z23 Encounter for immunization: Secondary | ICD-10-CM | POA: Diagnosis not present

## 2016-06-09 DIAGNOSIS — J3089 Other allergic rhinitis: Secondary | ICD-10-CM | POA: Diagnosis not present

## 2016-06-09 DIAGNOSIS — J301 Allergic rhinitis due to pollen: Secondary | ICD-10-CM | POA: Diagnosis not present

## 2016-06-23 DIAGNOSIS — J301 Allergic rhinitis due to pollen: Secondary | ICD-10-CM | POA: Diagnosis not present

## 2016-06-23 DIAGNOSIS — J3089 Other allergic rhinitis: Secondary | ICD-10-CM | POA: Diagnosis not present

## 2016-07-07 DIAGNOSIS — J301 Allergic rhinitis due to pollen: Secondary | ICD-10-CM | POA: Diagnosis not present

## 2016-07-07 DIAGNOSIS — J3089 Other allergic rhinitis: Secondary | ICD-10-CM | POA: Diagnosis not present

## 2016-07-18 DIAGNOSIS — Z803 Family history of malignant neoplasm of breast: Secondary | ICD-10-CM | POA: Diagnosis not present

## 2016-07-18 DIAGNOSIS — Z1231 Encounter for screening mammogram for malignant neoplasm of breast: Secondary | ICD-10-CM | POA: Diagnosis not present

## 2016-07-21 DIAGNOSIS — J3089 Other allergic rhinitis: Secondary | ICD-10-CM | POA: Diagnosis not present

## 2016-07-21 DIAGNOSIS — J301 Allergic rhinitis due to pollen: Secondary | ICD-10-CM | POA: Diagnosis not present

## 2016-08-01 DIAGNOSIS — E119 Type 2 diabetes mellitus without complications: Secondary | ICD-10-CM | POA: Diagnosis not present

## 2016-08-01 DIAGNOSIS — E784 Other hyperlipidemia: Secondary | ICD-10-CM | POA: Diagnosis not present

## 2016-08-01 DIAGNOSIS — I10 Essential (primary) hypertension: Secondary | ICD-10-CM | POA: Diagnosis not present

## 2016-08-04 DIAGNOSIS — J301 Allergic rhinitis due to pollen: Secondary | ICD-10-CM | POA: Diagnosis not present

## 2016-08-04 DIAGNOSIS — J3089 Other allergic rhinitis: Secondary | ICD-10-CM | POA: Diagnosis not present

## 2016-08-08 DIAGNOSIS — M1990 Unspecified osteoarthritis, unspecified site: Secondary | ICD-10-CM | POA: Diagnosis not present

## 2016-08-08 DIAGNOSIS — M810 Age-related osteoporosis without current pathological fracture: Secondary | ICD-10-CM | POA: Diagnosis not present

## 2016-08-08 DIAGNOSIS — E663 Overweight: Secondary | ICD-10-CM | POA: Diagnosis not present

## 2016-08-08 DIAGNOSIS — J302 Other seasonal allergic rhinitis: Secondary | ICD-10-CM | POA: Diagnosis not present

## 2016-08-08 DIAGNOSIS — E784 Other hyperlipidemia: Secondary | ICD-10-CM | POA: Diagnosis not present

## 2016-08-08 DIAGNOSIS — I10 Essential (primary) hypertension: Secondary | ICD-10-CM | POA: Diagnosis not present

## 2016-08-08 DIAGNOSIS — Z6828 Body mass index (BMI) 28.0-28.9, adult: Secondary | ICD-10-CM | POA: Diagnosis not present

## 2016-08-08 DIAGNOSIS — E119 Type 2 diabetes mellitus without complications: Secondary | ICD-10-CM | POA: Diagnosis not present

## 2016-08-12 DIAGNOSIS — Z1212 Encounter for screening for malignant neoplasm of rectum: Secondary | ICD-10-CM | POA: Diagnosis not present

## 2016-08-18 DIAGNOSIS — J301 Allergic rhinitis due to pollen: Secondary | ICD-10-CM | POA: Diagnosis not present

## 2016-08-18 DIAGNOSIS — J3089 Other allergic rhinitis: Secondary | ICD-10-CM | POA: Diagnosis not present

## 2016-09-01 DIAGNOSIS — J3089 Other allergic rhinitis: Secondary | ICD-10-CM | POA: Diagnosis not present

## 2016-09-01 DIAGNOSIS — J301 Allergic rhinitis due to pollen: Secondary | ICD-10-CM | POA: Diagnosis not present

## 2016-09-12 DIAGNOSIS — J3089 Other allergic rhinitis: Secondary | ICD-10-CM | POA: Diagnosis not present

## 2016-09-12 DIAGNOSIS — J301 Allergic rhinitis due to pollen: Secondary | ICD-10-CM | POA: Diagnosis not present

## 2016-09-29 DIAGNOSIS — J301 Allergic rhinitis due to pollen: Secondary | ICD-10-CM | POA: Diagnosis not present

## 2016-09-29 DIAGNOSIS — J3089 Other allergic rhinitis: Secondary | ICD-10-CM | POA: Diagnosis not present

## 2016-10-14 DIAGNOSIS — Z6824 Body mass index (BMI) 24.0-24.9, adult: Secondary | ICD-10-CM | POA: Diagnosis not present

## 2016-10-14 DIAGNOSIS — B349 Viral infection, unspecified: Secondary | ICD-10-CM | POA: Diagnosis not present

## 2016-10-14 DIAGNOSIS — J029 Acute pharyngitis, unspecified: Secondary | ICD-10-CM | POA: Diagnosis not present

## 2016-10-14 DIAGNOSIS — R05 Cough: Secondary | ICD-10-CM | POA: Diagnosis not present

## 2016-10-20 DIAGNOSIS — J301 Allergic rhinitis due to pollen: Secondary | ICD-10-CM | POA: Diagnosis not present

## 2016-10-20 DIAGNOSIS — J3089 Other allergic rhinitis: Secondary | ICD-10-CM | POA: Diagnosis not present

## 2016-10-27 DIAGNOSIS — J301 Allergic rhinitis due to pollen: Secondary | ICD-10-CM | POA: Diagnosis not present

## 2016-10-27 DIAGNOSIS — J3089 Other allergic rhinitis: Secondary | ICD-10-CM | POA: Diagnosis not present

## 2016-11-03 DIAGNOSIS — J3089 Other allergic rhinitis: Secondary | ICD-10-CM | POA: Diagnosis not present

## 2016-11-03 DIAGNOSIS — J301 Allergic rhinitis due to pollen: Secondary | ICD-10-CM | POA: Diagnosis not present

## 2016-11-06 DIAGNOSIS — J301 Allergic rhinitis due to pollen: Secondary | ICD-10-CM | POA: Diagnosis not present

## 2016-11-06 DIAGNOSIS — J3089 Other allergic rhinitis: Secondary | ICD-10-CM | POA: Diagnosis not present

## 2016-11-10 DIAGNOSIS — J301 Allergic rhinitis due to pollen: Secondary | ICD-10-CM | POA: Diagnosis not present

## 2016-11-10 DIAGNOSIS — J3089 Other allergic rhinitis: Secondary | ICD-10-CM | POA: Diagnosis not present

## 2016-11-17 DIAGNOSIS — J301 Allergic rhinitis due to pollen: Secondary | ICD-10-CM | POA: Diagnosis not present

## 2016-11-17 DIAGNOSIS — J3089 Other allergic rhinitis: Secondary | ICD-10-CM | POA: Diagnosis not present

## 2016-11-24 DIAGNOSIS — J3089 Other allergic rhinitis: Secondary | ICD-10-CM | POA: Diagnosis not present

## 2016-11-24 DIAGNOSIS — J301 Allergic rhinitis due to pollen: Secondary | ICD-10-CM | POA: Diagnosis not present

## 2016-12-02 DIAGNOSIS — J301 Allergic rhinitis due to pollen: Secondary | ICD-10-CM | POA: Diagnosis not present

## 2016-12-02 DIAGNOSIS — J3089 Other allergic rhinitis: Secondary | ICD-10-CM | POA: Diagnosis not present

## 2016-12-04 DIAGNOSIS — J3089 Other allergic rhinitis: Secondary | ICD-10-CM | POA: Diagnosis not present

## 2016-12-04 DIAGNOSIS — J301 Allergic rhinitis due to pollen: Secondary | ICD-10-CM | POA: Diagnosis not present

## 2016-12-05 DIAGNOSIS — E119 Type 2 diabetes mellitus without complications: Secondary | ICD-10-CM | POA: Diagnosis not present

## 2016-12-05 DIAGNOSIS — Z6828 Body mass index (BMI) 28.0-28.9, adult: Secondary | ICD-10-CM | POA: Diagnosis not present

## 2016-12-05 DIAGNOSIS — I10 Essential (primary) hypertension: Secondary | ICD-10-CM | POA: Diagnosis not present

## 2016-12-05 DIAGNOSIS — M25561 Pain in right knee: Secondary | ICD-10-CM | POA: Diagnosis not present

## 2016-12-08 DIAGNOSIS — J3089 Other allergic rhinitis: Secondary | ICD-10-CM | POA: Diagnosis not present

## 2016-12-08 DIAGNOSIS — J301 Allergic rhinitis due to pollen: Secondary | ICD-10-CM | POA: Diagnosis not present

## 2016-12-12 DIAGNOSIS — J3089 Other allergic rhinitis: Secondary | ICD-10-CM | POA: Diagnosis not present

## 2016-12-12 DIAGNOSIS — J301 Allergic rhinitis due to pollen: Secondary | ICD-10-CM | POA: Diagnosis not present

## 2016-12-15 DIAGNOSIS — J301 Allergic rhinitis due to pollen: Secondary | ICD-10-CM | POA: Diagnosis not present

## 2016-12-15 DIAGNOSIS — J3089 Other allergic rhinitis: Secondary | ICD-10-CM | POA: Diagnosis not present

## 2016-12-22 DIAGNOSIS — J3089 Other allergic rhinitis: Secondary | ICD-10-CM | POA: Diagnosis not present

## 2016-12-22 DIAGNOSIS — J301 Allergic rhinitis due to pollen: Secondary | ICD-10-CM | POA: Diagnosis not present

## 2016-12-29 DIAGNOSIS — J301 Allergic rhinitis due to pollen: Secondary | ICD-10-CM | POA: Diagnosis not present

## 2016-12-29 DIAGNOSIS — J3089 Other allergic rhinitis: Secondary | ICD-10-CM | POA: Diagnosis not present

## 2017-01-05 DIAGNOSIS — J3089 Other allergic rhinitis: Secondary | ICD-10-CM | POA: Diagnosis not present

## 2017-01-05 DIAGNOSIS — J301 Allergic rhinitis due to pollen: Secondary | ICD-10-CM | POA: Diagnosis not present

## 2017-01-12 DIAGNOSIS — J301 Allergic rhinitis due to pollen: Secondary | ICD-10-CM | POA: Diagnosis not present

## 2017-01-12 DIAGNOSIS — J3089 Other allergic rhinitis: Secondary | ICD-10-CM | POA: Diagnosis not present

## 2017-01-19 DIAGNOSIS — J301 Allergic rhinitis due to pollen: Secondary | ICD-10-CM | POA: Diagnosis not present

## 2017-01-19 DIAGNOSIS — J3089 Other allergic rhinitis: Secondary | ICD-10-CM | POA: Diagnosis not present

## 2017-01-26 DIAGNOSIS — J301 Allergic rhinitis due to pollen: Secondary | ICD-10-CM | POA: Diagnosis not present

## 2017-01-26 DIAGNOSIS — J3089 Other allergic rhinitis: Secondary | ICD-10-CM | POA: Diagnosis not present

## 2017-02-02 DIAGNOSIS — J3089 Other allergic rhinitis: Secondary | ICD-10-CM | POA: Diagnosis not present

## 2017-02-02 DIAGNOSIS — J301 Allergic rhinitis due to pollen: Secondary | ICD-10-CM | POA: Diagnosis not present

## 2017-02-03 DIAGNOSIS — Z124 Encounter for screening for malignant neoplasm of cervix: Secondary | ICD-10-CM | POA: Diagnosis not present

## 2017-02-09 DIAGNOSIS — J3089 Other allergic rhinitis: Secondary | ICD-10-CM | POA: Diagnosis not present

## 2017-02-09 DIAGNOSIS — J301 Allergic rhinitis due to pollen: Secondary | ICD-10-CM | POA: Diagnosis not present

## 2017-02-13 DIAGNOSIS — E119 Type 2 diabetes mellitus without complications: Secondary | ICD-10-CM | POA: Diagnosis not present

## 2017-02-13 DIAGNOSIS — H40033 Anatomical narrow angle, bilateral: Secondary | ICD-10-CM | POA: Diagnosis not present

## 2017-02-13 DIAGNOSIS — H25813 Combined forms of age-related cataract, bilateral: Secondary | ICD-10-CM | POA: Diagnosis not present

## 2017-02-13 DIAGNOSIS — H5203 Hypermetropia, bilateral: Secondary | ICD-10-CM | POA: Diagnosis not present

## 2017-02-17 DIAGNOSIS — J3089 Other allergic rhinitis: Secondary | ICD-10-CM | POA: Diagnosis not present

## 2017-02-17 DIAGNOSIS — J301 Allergic rhinitis due to pollen: Secondary | ICD-10-CM | POA: Diagnosis not present

## 2017-02-23 DIAGNOSIS — J3089 Other allergic rhinitis: Secondary | ICD-10-CM | POA: Diagnosis not present

## 2017-02-23 DIAGNOSIS — J301 Allergic rhinitis due to pollen: Secondary | ICD-10-CM | POA: Diagnosis not present

## 2017-03-02 DIAGNOSIS — J3089 Other allergic rhinitis: Secondary | ICD-10-CM | POA: Diagnosis not present

## 2017-03-02 DIAGNOSIS — J301 Allergic rhinitis due to pollen: Secondary | ICD-10-CM | POA: Diagnosis not present

## 2017-03-09 DIAGNOSIS — J301 Allergic rhinitis due to pollen: Secondary | ICD-10-CM | POA: Diagnosis not present

## 2017-03-09 DIAGNOSIS — J3089 Other allergic rhinitis: Secondary | ICD-10-CM | POA: Diagnosis not present

## 2017-03-16 DIAGNOSIS — J3089 Other allergic rhinitis: Secondary | ICD-10-CM | POA: Diagnosis not present

## 2017-03-16 DIAGNOSIS — J301 Allergic rhinitis due to pollen: Secondary | ICD-10-CM | POA: Diagnosis not present

## 2017-03-27 DIAGNOSIS — J301 Allergic rhinitis due to pollen: Secondary | ICD-10-CM | POA: Diagnosis not present

## 2017-03-27 DIAGNOSIS — J3089 Other allergic rhinitis: Secondary | ICD-10-CM | POA: Diagnosis not present

## 2017-04-03 DIAGNOSIS — J3089 Other allergic rhinitis: Secondary | ICD-10-CM | POA: Diagnosis not present

## 2017-04-03 DIAGNOSIS — J301 Allergic rhinitis due to pollen: Secondary | ICD-10-CM | POA: Diagnosis not present

## 2017-04-06 DIAGNOSIS — I10 Essential (primary) hypertension: Secondary | ICD-10-CM | POA: Diagnosis not present

## 2017-04-06 DIAGNOSIS — E784 Other hyperlipidemia: Secondary | ICD-10-CM | POA: Diagnosis not present

## 2017-04-06 DIAGNOSIS — Z23 Encounter for immunization: Secondary | ICD-10-CM | POA: Diagnosis not present

## 2017-04-06 DIAGNOSIS — E663 Overweight: Secondary | ICD-10-CM | POA: Diagnosis not present

## 2017-04-06 DIAGNOSIS — J302 Other seasonal allergic rhinitis: Secondary | ICD-10-CM | POA: Diagnosis not present

## 2017-04-06 DIAGNOSIS — E119 Type 2 diabetes mellitus without complications: Secondary | ICD-10-CM | POA: Diagnosis not present

## 2017-04-06 DIAGNOSIS — Z6828 Body mass index (BMI) 28.0-28.9, adult: Secondary | ICD-10-CM | POA: Diagnosis not present

## 2017-04-06 DIAGNOSIS — M810 Age-related osteoporosis without current pathological fracture: Secondary | ICD-10-CM | POA: Diagnosis not present

## 2017-04-06 DIAGNOSIS — M1990 Unspecified osteoarthritis, unspecified site: Secondary | ICD-10-CM | POA: Diagnosis not present

## 2017-04-06 DIAGNOSIS — M25561 Pain in right knee: Secondary | ICD-10-CM | POA: Diagnosis not present

## 2017-04-06 DIAGNOSIS — J301 Allergic rhinitis due to pollen: Secondary | ICD-10-CM | POA: Diagnosis not present

## 2017-04-06 DIAGNOSIS — J3089 Other allergic rhinitis: Secondary | ICD-10-CM | POA: Diagnosis not present

## 2017-04-13 DIAGNOSIS — J301 Allergic rhinitis due to pollen: Secondary | ICD-10-CM | POA: Diagnosis not present

## 2017-04-13 DIAGNOSIS — J3089 Other allergic rhinitis: Secondary | ICD-10-CM | POA: Diagnosis not present

## 2017-04-20 DIAGNOSIS — J3089 Other allergic rhinitis: Secondary | ICD-10-CM | POA: Diagnosis not present

## 2017-04-20 DIAGNOSIS — J301 Allergic rhinitis due to pollen: Secondary | ICD-10-CM | POA: Diagnosis not present

## 2017-04-27 DIAGNOSIS — J3089 Other allergic rhinitis: Secondary | ICD-10-CM | POA: Diagnosis not present

## 2017-04-27 DIAGNOSIS — J301 Allergic rhinitis due to pollen: Secondary | ICD-10-CM | POA: Diagnosis not present

## 2017-05-04 DIAGNOSIS — J301 Allergic rhinitis due to pollen: Secondary | ICD-10-CM | POA: Diagnosis not present

## 2017-05-04 DIAGNOSIS — J3089 Other allergic rhinitis: Secondary | ICD-10-CM | POA: Diagnosis not present

## 2017-05-06 DIAGNOSIS — J301 Allergic rhinitis due to pollen: Secondary | ICD-10-CM | POA: Diagnosis not present

## 2017-05-06 DIAGNOSIS — J3089 Other allergic rhinitis: Secondary | ICD-10-CM | POA: Diagnosis not present

## 2017-05-11 DIAGNOSIS — J301 Allergic rhinitis due to pollen: Secondary | ICD-10-CM | POA: Diagnosis not present

## 2017-05-11 DIAGNOSIS — J3089 Other allergic rhinitis: Secondary | ICD-10-CM | POA: Diagnosis not present

## 2017-05-12 DIAGNOSIS — J3081 Allergic rhinitis due to animal (cat) (dog) hair and dander: Secondary | ICD-10-CM | POA: Diagnosis not present

## 2017-05-12 DIAGNOSIS — J301 Allergic rhinitis due to pollen: Secondary | ICD-10-CM | POA: Diagnosis not present

## 2017-05-12 DIAGNOSIS — J3089 Other allergic rhinitis: Secondary | ICD-10-CM | POA: Diagnosis not present

## 2017-05-12 DIAGNOSIS — T63441D Toxic effect of venom of bees, accidental (unintentional), subsequent encounter: Secondary | ICD-10-CM | POA: Diagnosis not present

## 2017-05-15 DIAGNOSIS — J3089 Other allergic rhinitis: Secondary | ICD-10-CM | POA: Diagnosis not present

## 2017-05-15 DIAGNOSIS — J301 Allergic rhinitis due to pollen: Secondary | ICD-10-CM | POA: Diagnosis not present

## 2017-05-18 DIAGNOSIS — J3089 Other allergic rhinitis: Secondary | ICD-10-CM | POA: Diagnosis not present

## 2017-05-18 DIAGNOSIS — J301 Allergic rhinitis due to pollen: Secondary | ICD-10-CM | POA: Diagnosis not present

## 2017-05-26 DIAGNOSIS — J3089 Other allergic rhinitis: Secondary | ICD-10-CM | POA: Diagnosis not present

## 2017-05-26 DIAGNOSIS — J301 Allergic rhinitis due to pollen: Secondary | ICD-10-CM | POA: Diagnosis not present

## 2017-05-29 DIAGNOSIS — Z23 Encounter for immunization: Secondary | ICD-10-CM | POA: Diagnosis not present

## 2017-06-01 DIAGNOSIS — J3089 Other allergic rhinitis: Secondary | ICD-10-CM | POA: Diagnosis not present

## 2017-06-01 DIAGNOSIS — J301 Allergic rhinitis due to pollen: Secondary | ICD-10-CM | POA: Diagnosis not present

## 2017-06-09 DIAGNOSIS — J3089 Other allergic rhinitis: Secondary | ICD-10-CM | POA: Diagnosis not present

## 2017-06-09 DIAGNOSIS — J301 Allergic rhinitis due to pollen: Secondary | ICD-10-CM | POA: Diagnosis not present

## 2017-06-17 DIAGNOSIS — J301 Allergic rhinitis due to pollen: Secondary | ICD-10-CM | POA: Diagnosis not present

## 2017-06-17 DIAGNOSIS — J3089 Other allergic rhinitis: Secondary | ICD-10-CM | POA: Diagnosis not present

## 2017-06-24 DIAGNOSIS — J301 Allergic rhinitis due to pollen: Secondary | ICD-10-CM | POA: Diagnosis not present

## 2017-06-24 DIAGNOSIS — J3089 Other allergic rhinitis: Secondary | ICD-10-CM | POA: Diagnosis not present

## 2017-07-01 DIAGNOSIS — J3089 Other allergic rhinitis: Secondary | ICD-10-CM | POA: Diagnosis not present

## 2017-07-01 DIAGNOSIS — J301 Allergic rhinitis due to pollen: Secondary | ICD-10-CM | POA: Diagnosis not present

## 2017-07-08 DIAGNOSIS — J301 Allergic rhinitis due to pollen: Secondary | ICD-10-CM | POA: Diagnosis not present

## 2017-07-08 DIAGNOSIS — J3089 Other allergic rhinitis: Secondary | ICD-10-CM | POA: Diagnosis not present

## 2017-07-15 DIAGNOSIS — J3089 Other allergic rhinitis: Secondary | ICD-10-CM | POA: Diagnosis not present

## 2017-07-15 DIAGNOSIS — J301 Allergic rhinitis due to pollen: Secondary | ICD-10-CM | POA: Diagnosis not present

## 2017-07-22 DIAGNOSIS — J3089 Other allergic rhinitis: Secondary | ICD-10-CM | POA: Diagnosis not present

## 2017-07-22 DIAGNOSIS — J301 Allergic rhinitis due to pollen: Secondary | ICD-10-CM | POA: Diagnosis not present

## 2017-07-24 DIAGNOSIS — Z1231 Encounter for screening mammogram for malignant neoplasm of breast: Secondary | ICD-10-CM | POA: Diagnosis not present

## 2017-07-24 DIAGNOSIS — Z803 Family history of malignant neoplasm of breast: Secondary | ICD-10-CM | POA: Diagnosis not present

## 2017-07-29 DIAGNOSIS — J301 Allergic rhinitis due to pollen: Secondary | ICD-10-CM | POA: Diagnosis not present

## 2017-07-29 DIAGNOSIS — J3089 Other allergic rhinitis: Secondary | ICD-10-CM | POA: Diagnosis not present

## 2017-08-03 DIAGNOSIS — M810 Age-related osteoporosis without current pathological fracture: Secondary | ICD-10-CM | POA: Diagnosis not present

## 2017-08-03 DIAGNOSIS — I10 Essential (primary) hypertension: Secondary | ICD-10-CM | POA: Diagnosis not present

## 2017-08-03 DIAGNOSIS — E119 Type 2 diabetes mellitus without complications: Secondary | ICD-10-CM | POA: Diagnosis not present

## 2017-08-03 DIAGNOSIS — R82998 Other abnormal findings in urine: Secondary | ICD-10-CM | POA: Diagnosis not present

## 2017-08-05 DIAGNOSIS — J301 Allergic rhinitis due to pollen: Secondary | ICD-10-CM | POA: Diagnosis not present

## 2017-08-05 DIAGNOSIS — J3089 Other allergic rhinitis: Secondary | ICD-10-CM | POA: Diagnosis not present

## 2017-08-10 DIAGNOSIS — E663 Overweight: Secondary | ICD-10-CM | POA: Diagnosis not present

## 2017-08-10 DIAGNOSIS — M1990 Unspecified osteoarthritis, unspecified site: Secondary | ICD-10-CM | POA: Diagnosis not present

## 2017-08-10 DIAGNOSIS — M810 Age-related osteoporosis without current pathological fracture: Secondary | ICD-10-CM | POA: Diagnosis not present

## 2017-08-10 DIAGNOSIS — E119 Type 2 diabetes mellitus without complications: Secondary | ICD-10-CM | POA: Diagnosis not present

## 2017-08-10 DIAGNOSIS — Z0000 Encounter for general adult medical examination without abnormal findings: Secondary | ICD-10-CM | POA: Diagnosis not present

## 2017-08-10 DIAGNOSIS — J302 Other seasonal allergic rhinitis: Secondary | ICD-10-CM | POA: Diagnosis not present

## 2017-08-10 DIAGNOSIS — E7849 Other hyperlipidemia: Secondary | ICD-10-CM | POA: Diagnosis not present

## 2017-08-10 DIAGNOSIS — Z6828 Body mass index (BMI) 28.0-28.9, adult: Secondary | ICD-10-CM | POA: Diagnosis not present

## 2017-08-10 DIAGNOSIS — I10 Essential (primary) hypertension: Secondary | ICD-10-CM | POA: Diagnosis not present

## 2017-08-10 DIAGNOSIS — Z1389 Encounter for screening for other disorder: Secondary | ICD-10-CM | POA: Diagnosis not present

## 2017-08-12 DIAGNOSIS — J301 Allergic rhinitis due to pollen: Secondary | ICD-10-CM | POA: Diagnosis not present

## 2017-08-12 DIAGNOSIS — J3089 Other allergic rhinitis: Secondary | ICD-10-CM | POA: Diagnosis not present

## 2017-08-18 DIAGNOSIS — Z1212 Encounter for screening for malignant neoplasm of rectum: Secondary | ICD-10-CM | POA: Diagnosis not present

## 2017-08-19 DIAGNOSIS — J301 Allergic rhinitis due to pollen: Secondary | ICD-10-CM | POA: Diagnosis not present

## 2017-08-19 DIAGNOSIS — J3089 Other allergic rhinitis: Secondary | ICD-10-CM | POA: Diagnosis not present

## 2017-08-26 DIAGNOSIS — J301 Allergic rhinitis due to pollen: Secondary | ICD-10-CM | POA: Diagnosis not present

## 2017-08-26 DIAGNOSIS — J3089 Other allergic rhinitis: Secondary | ICD-10-CM | POA: Diagnosis not present

## 2017-09-02 DIAGNOSIS — J301 Allergic rhinitis due to pollen: Secondary | ICD-10-CM | POA: Diagnosis not present

## 2017-09-02 DIAGNOSIS — J3089 Other allergic rhinitis: Secondary | ICD-10-CM | POA: Diagnosis not present

## 2017-09-10 DIAGNOSIS — J3089 Other allergic rhinitis: Secondary | ICD-10-CM | POA: Diagnosis not present

## 2017-09-10 DIAGNOSIS — J301 Allergic rhinitis due to pollen: Secondary | ICD-10-CM | POA: Diagnosis not present

## 2017-09-11 DIAGNOSIS — J3089 Other allergic rhinitis: Secondary | ICD-10-CM | POA: Diagnosis not present

## 2017-09-11 DIAGNOSIS — J301 Allergic rhinitis due to pollen: Secondary | ICD-10-CM | POA: Diagnosis not present

## 2017-09-17 DIAGNOSIS — J3089 Other allergic rhinitis: Secondary | ICD-10-CM | POA: Diagnosis not present

## 2017-09-17 DIAGNOSIS — J301 Allergic rhinitis due to pollen: Secondary | ICD-10-CM | POA: Diagnosis not present

## 2017-09-23 DIAGNOSIS — J301 Allergic rhinitis due to pollen: Secondary | ICD-10-CM | POA: Diagnosis not present

## 2017-09-23 DIAGNOSIS — J3089 Other allergic rhinitis: Secondary | ICD-10-CM | POA: Diagnosis not present

## 2017-09-29 DIAGNOSIS — J301 Allergic rhinitis due to pollen: Secondary | ICD-10-CM | POA: Diagnosis not present

## 2017-09-29 DIAGNOSIS — J3089 Other allergic rhinitis: Secondary | ICD-10-CM | POA: Diagnosis not present

## 2017-10-02 DIAGNOSIS — J301 Allergic rhinitis due to pollen: Secondary | ICD-10-CM | POA: Diagnosis not present

## 2017-10-02 DIAGNOSIS — J3089 Other allergic rhinitis: Secondary | ICD-10-CM | POA: Diagnosis not present

## 2017-10-07 DIAGNOSIS — Z6829 Body mass index (BMI) 29.0-29.9, adult: Secondary | ICD-10-CM | POA: Diagnosis not present

## 2017-10-07 DIAGNOSIS — I10 Essential (primary) hypertension: Secondary | ICD-10-CM | POA: Diagnosis not present

## 2017-10-07 DIAGNOSIS — J3089 Other allergic rhinitis: Secondary | ICD-10-CM | POA: Diagnosis not present

## 2017-10-07 DIAGNOSIS — M810 Age-related osteoporosis without current pathological fracture: Secondary | ICD-10-CM | POA: Diagnosis not present

## 2017-10-07 DIAGNOSIS — E119 Type 2 diabetes mellitus without complications: Secondary | ICD-10-CM | POA: Diagnosis not present

## 2017-10-07 DIAGNOSIS — J301 Allergic rhinitis due to pollen: Secondary | ICD-10-CM | POA: Diagnosis not present

## 2017-10-09 DIAGNOSIS — J301 Allergic rhinitis due to pollen: Secondary | ICD-10-CM | POA: Diagnosis not present

## 2017-10-09 DIAGNOSIS — J3089 Other allergic rhinitis: Secondary | ICD-10-CM | POA: Diagnosis not present

## 2017-10-14 DIAGNOSIS — J3089 Other allergic rhinitis: Secondary | ICD-10-CM | POA: Diagnosis not present

## 2017-10-14 DIAGNOSIS — J301 Allergic rhinitis due to pollen: Secondary | ICD-10-CM | POA: Diagnosis not present

## 2017-10-21 DIAGNOSIS — J301 Allergic rhinitis due to pollen: Secondary | ICD-10-CM | POA: Diagnosis not present

## 2017-10-21 DIAGNOSIS — J3089 Other allergic rhinitis: Secondary | ICD-10-CM | POA: Diagnosis not present

## 2017-10-28 DIAGNOSIS — J3089 Other allergic rhinitis: Secondary | ICD-10-CM | POA: Diagnosis not present

## 2017-10-28 DIAGNOSIS — J301 Allergic rhinitis due to pollen: Secondary | ICD-10-CM | POA: Diagnosis not present

## 2017-11-04 DIAGNOSIS — J301 Allergic rhinitis due to pollen: Secondary | ICD-10-CM | POA: Diagnosis not present

## 2017-11-04 DIAGNOSIS — J3089 Other allergic rhinitis: Secondary | ICD-10-CM | POA: Diagnosis not present

## 2017-11-11 DIAGNOSIS — J301 Allergic rhinitis due to pollen: Secondary | ICD-10-CM | POA: Diagnosis not present

## 2017-11-11 DIAGNOSIS — J3089 Other allergic rhinitis: Secondary | ICD-10-CM | POA: Diagnosis not present

## 2017-11-18 DIAGNOSIS — J301 Allergic rhinitis due to pollen: Secondary | ICD-10-CM | POA: Diagnosis not present

## 2017-11-18 DIAGNOSIS — J3089 Other allergic rhinitis: Secondary | ICD-10-CM | POA: Diagnosis not present

## 2017-11-25 DIAGNOSIS — J301 Allergic rhinitis due to pollen: Secondary | ICD-10-CM | POA: Diagnosis not present

## 2017-11-25 DIAGNOSIS — J3089 Other allergic rhinitis: Secondary | ICD-10-CM | POA: Diagnosis not present

## 2017-12-04 DIAGNOSIS — J301 Allergic rhinitis due to pollen: Secondary | ICD-10-CM | POA: Diagnosis not present

## 2017-12-04 DIAGNOSIS — J3089 Other allergic rhinitis: Secondary | ICD-10-CM | POA: Diagnosis not present

## 2017-12-09 DIAGNOSIS — J3089 Other allergic rhinitis: Secondary | ICD-10-CM | POA: Diagnosis not present

## 2017-12-09 DIAGNOSIS — J301 Allergic rhinitis due to pollen: Secondary | ICD-10-CM | POA: Diagnosis not present

## 2017-12-16 DIAGNOSIS — J301 Allergic rhinitis due to pollen: Secondary | ICD-10-CM | POA: Diagnosis not present

## 2017-12-16 DIAGNOSIS — J3089 Other allergic rhinitis: Secondary | ICD-10-CM | POA: Diagnosis not present

## 2017-12-23 DIAGNOSIS — J301 Allergic rhinitis due to pollen: Secondary | ICD-10-CM | POA: Diagnosis not present

## 2017-12-23 DIAGNOSIS — J3089 Other allergic rhinitis: Secondary | ICD-10-CM | POA: Diagnosis not present

## 2017-12-30 DIAGNOSIS — E663 Overweight: Secondary | ICD-10-CM | POA: Diagnosis not present

## 2017-12-30 DIAGNOSIS — J302 Other seasonal allergic rhinitis: Secondary | ICD-10-CM | POA: Diagnosis not present

## 2017-12-30 DIAGNOSIS — M1990 Unspecified osteoarthritis, unspecified site: Secondary | ICD-10-CM | POA: Diagnosis not present

## 2017-12-30 DIAGNOSIS — E7849 Other hyperlipidemia: Secondary | ICD-10-CM | POA: Diagnosis not present

## 2017-12-30 DIAGNOSIS — I10 Essential (primary) hypertension: Secondary | ICD-10-CM | POA: Diagnosis not present

## 2017-12-30 DIAGNOSIS — Z1389 Encounter for screening for other disorder: Secondary | ICD-10-CM | POA: Diagnosis not present

## 2017-12-30 DIAGNOSIS — J301 Allergic rhinitis due to pollen: Secondary | ICD-10-CM | POA: Diagnosis not present

## 2017-12-30 DIAGNOSIS — E1169 Type 2 diabetes mellitus with other specified complication: Secondary | ICD-10-CM | POA: Diagnosis not present

## 2017-12-30 DIAGNOSIS — J3089 Other allergic rhinitis: Secondary | ICD-10-CM | POA: Diagnosis not present

## 2017-12-30 DIAGNOSIS — M810 Age-related osteoporosis without current pathological fracture: Secondary | ICD-10-CM | POA: Diagnosis not present

## 2018-01-06 DIAGNOSIS — J3089 Other allergic rhinitis: Secondary | ICD-10-CM | POA: Diagnosis not present

## 2018-01-06 DIAGNOSIS — J301 Allergic rhinitis due to pollen: Secondary | ICD-10-CM | POA: Diagnosis not present

## 2018-01-13 DIAGNOSIS — J301 Allergic rhinitis due to pollen: Secondary | ICD-10-CM | POA: Diagnosis not present

## 2018-01-13 DIAGNOSIS — J3089 Other allergic rhinitis: Secondary | ICD-10-CM | POA: Diagnosis not present

## 2018-01-15 DIAGNOSIS — E113291 Type 2 diabetes mellitus with mild nonproliferative diabetic retinopathy without macular edema, right eye: Secondary | ICD-10-CM | POA: Diagnosis not present

## 2018-01-18 DIAGNOSIS — J301 Allergic rhinitis due to pollen: Secondary | ICD-10-CM | POA: Diagnosis not present

## 2018-01-18 DIAGNOSIS — J3089 Other allergic rhinitis: Secondary | ICD-10-CM | POA: Diagnosis not present

## 2018-01-20 DIAGNOSIS — J301 Allergic rhinitis due to pollen: Secondary | ICD-10-CM | POA: Diagnosis not present

## 2018-01-20 DIAGNOSIS — J3089 Other allergic rhinitis: Secondary | ICD-10-CM | POA: Diagnosis not present

## 2018-01-20 DIAGNOSIS — J3081 Allergic rhinitis due to animal (cat) (dog) hair and dander: Secondary | ICD-10-CM | POA: Diagnosis not present

## 2018-01-27 DIAGNOSIS — J301 Allergic rhinitis due to pollen: Secondary | ICD-10-CM | POA: Diagnosis not present

## 2018-01-27 DIAGNOSIS — J3089 Other allergic rhinitis: Secondary | ICD-10-CM | POA: Diagnosis not present

## 2018-02-03 DIAGNOSIS — J3089 Other allergic rhinitis: Secondary | ICD-10-CM | POA: Diagnosis not present

## 2018-02-03 DIAGNOSIS — J301 Allergic rhinitis due to pollen: Secondary | ICD-10-CM | POA: Diagnosis not present

## 2018-02-10 DIAGNOSIS — J301 Allergic rhinitis due to pollen: Secondary | ICD-10-CM | POA: Diagnosis not present

## 2018-02-10 DIAGNOSIS — J3089 Other allergic rhinitis: Secondary | ICD-10-CM | POA: Diagnosis not present

## 2018-02-17 DIAGNOSIS — J301 Allergic rhinitis due to pollen: Secondary | ICD-10-CM | POA: Diagnosis not present

## 2018-02-25 DIAGNOSIS — J3089 Other allergic rhinitis: Secondary | ICD-10-CM | POA: Diagnosis not present

## 2018-02-25 DIAGNOSIS — J301 Allergic rhinitis due to pollen: Secondary | ICD-10-CM | POA: Diagnosis not present

## 2018-03-01 DIAGNOSIS — J301 Allergic rhinitis due to pollen: Secondary | ICD-10-CM | POA: Diagnosis not present

## 2018-03-01 DIAGNOSIS — J3089 Other allergic rhinitis: Secondary | ICD-10-CM | POA: Diagnosis not present

## 2018-03-03 DIAGNOSIS — J3089 Other allergic rhinitis: Secondary | ICD-10-CM | POA: Diagnosis not present

## 2018-03-03 DIAGNOSIS — J301 Allergic rhinitis due to pollen: Secondary | ICD-10-CM | POA: Diagnosis not present

## 2018-03-08 DIAGNOSIS — J301 Allergic rhinitis due to pollen: Secondary | ICD-10-CM | POA: Diagnosis not present

## 2018-03-08 DIAGNOSIS — J3089 Other allergic rhinitis: Secondary | ICD-10-CM | POA: Diagnosis not present

## 2018-03-10 DIAGNOSIS — J301 Allergic rhinitis due to pollen: Secondary | ICD-10-CM | POA: Diagnosis not present

## 2018-03-10 DIAGNOSIS — J3089 Other allergic rhinitis: Secondary | ICD-10-CM | POA: Diagnosis not present

## 2018-03-11 DIAGNOSIS — H353121 Nonexudative age-related macular degeneration, left eye, early dry stage: Secondary | ICD-10-CM | POA: Diagnosis not present

## 2018-03-11 DIAGNOSIS — E113211 Type 2 diabetes mellitus with mild nonproliferative diabetic retinopathy with macular edema, right eye: Secondary | ICD-10-CM | POA: Diagnosis not present

## 2018-03-11 DIAGNOSIS — H43811 Vitreous degeneration, right eye: Secondary | ICD-10-CM | POA: Diagnosis not present

## 2018-03-17 DIAGNOSIS — J301 Allergic rhinitis due to pollen: Secondary | ICD-10-CM | POA: Diagnosis not present

## 2018-03-17 DIAGNOSIS — J3089 Other allergic rhinitis: Secondary | ICD-10-CM | POA: Diagnosis not present

## 2018-03-24 DIAGNOSIS — J3089 Other allergic rhinitis: Secondary | ICD-10-CM | POA: Diagnosis not present

## 2018-03-24 DIAGNOSIS — J301 Allergic rhinitis due to pollen: Secondary | ICD-10-CM | POA: Diagnosis not present

## 2018-03-31 DIAGNOSIS — J301 Allergic rhinitis due to pollen: Secondary | ICD-10-CM | POA: Diagnosis not present

## 2018-03-31 DIAGNOSIS — J3089 Other allergic rhinitis: Secondary | ICD-10-CM | POA: Diagnosis not present

## 2018-04-05 DIAGNOSIS — L308 Other specified dermatitis: Secondary | ICD-10-CM | POA: Diagnosis not present

## 2018-04-05 DIAGNOSIS — J302 Other seasonal allergic rhinitis: Secondary | ICD-10-CM | POA: Diagnosis not present

## 2018-04-05 DIAGNOSIS — Z6828 Body mass index (BMI) 28.0-28.9, adult: Secondary | ICD-10-CM | POA: Diagnosis not present

## 2018-04-05 DIAGNOSIS — E663 Overweight: Secondary | ICD-10-CM | POA: Diagnosis not present

## 2018-04-05 DIAGNOSIS — E1169 Type 2 diabetes mellitus with other specified complication: Secondary | ICD-10-CM | POA: Diagnosis not present

## 2018-04-05 DIAGNOSIS — M810 Age-related osteoporosis without current pathological fracture: Secondary | ICD-10-CM | POA: Diagnosis not present

## 2018-04-05 DIAGNOSIS — I10 Essential (primary) hypertension: Secondary | ICD-10-CM | POA: Diagnosis not present

## 2018-04-05 DIAGNOSIS — M1990 Unspecified osteoarthritis, unspecified site: Secondary | ICD-10-CM | POA: Diagnosis not present

## 2018-04-05 DIAGNOSIS — E7849 Other hyperlipidemia: Secondary | ICD-10-CM | POA: Diagnosis not present

## 2018-04-07 DIAGNOSIS — J301 Allergic rhinitis due to pollen: Secondary | ICD-10-CM | POA: Diagnosis not present

## 2018-04-07 DIAGNOSIS — J3089 Other allergic rhinitis: Secondary | ICD-10-CM | POA: Diagnosis not present

## 2018-04-21 DIAGNOSIS — J3089 Other allergic rhinitis: Secondary | ICD-10-CM | POA: Diagnosis not present

## 2018-04-21 DIAGNOSIS — J301 Allergic rhinitis due to pollen: Secondary | ICD-10-CM | POA: Diagnosis not present

## 2018-04-28 DIAGNOSIS — J3089 Other allergic rhinitis: Secondary | ICD-10-CM | POA: Diagnosis not present

## 2018-04-28 DIAGNOSIS — J301 Allergic rhinitis due to pollen: Secondary | ICD-10-CM | POA: Diagnosis not present

## 2018-05-07 DIAGNOSIS — J3081 Allergic rhinitis due to animal (cat) (dog) hair and dander: Secondary | ICD-10-CM | POA: Diagnosis not present

## 2018-05-07 DIAGNOSIS — J301 Allergic rhinitis due to pollen: Secondary | ICD-10-CM | POA: Diagnosis not present

## 2018-05-07 DIAGNOSIS — T63441D Toxic effect of venom of bees, accidental (unintentional), subsequent encounter: Secondary | ICD-10-CM | POA: Diagnosis not present

## 2018-05-07 DIAGNOSIS — J3089 Other allergic rhinitis: Secondary | ICD-10-CM | POA: Diagnosis not present

## 2018-05-11 DIAGNOSIS — Z01419 Encounter for gynecological examination (general) (routine) without abnormal findings: Secondary | ICD-10-CM | POA: Diagnosis not present

## 2018-05-11 DIAGNOSIS — Z124 Encounter for screening for malignant neoplasm of cervix: Secondary | ICD-10-CM | POA: Diagnosis not present

## 2018-05-12 DIAGNOSIS — J3089 Other allergic rhinitis: Secondary | ICD-10-CM | POA: Diagnosis not present

## 2018-05-12 DIAGNOSIS — J301 Allergic rhinitis due to pollen: Secondary | ICD-10-CM | POA: Diagnosis not present

## 2018-05-19 DIAGNOSIS — J301 Allergic rhinitis due to pollen: Secondary | ICD-10-CM | POA: Diagnosis not present

## 2018-05-19 DIAGNOSIS — J3089 Other allergic rhinitis: Secondary | ICD-10-CM | POA: Diagnosis not present

## 2018-05-26 DIAGNOSIS — J3089 Other allergic rhinitis: Secondary | ICD-10-CM | POA: Diagnosis not present

## 2018-05-26 DIAGNOSIS — J301 Allergic rhinitis due to pollen: Secondary | ICD-10-CM | POA: Diagnosis not present

## 2018-05-28 DIAGNOSIS — Z23 Encounter for immunization: Secondary | ICD-10-CM | POA: Diagnosis not present

## 2018-06-02 DIAGNOSIS — J3089 Other allergic rhinitis: Secondary | ICD-10-CM | POA: Diagnosis not present

## 2018-06-02 DIAGNOSIS — J301 Allergic rhinitis due to pollen: Secondary | ICD-10-CM | POA: Diagnosis not present

## 2018-06-09 DIAGNOSIS — J301 Allergic rhinitis due to pollen: Secondary | ICD-10-CM | POA: Diagnosis not present

## 2018-06-09 DIAGNOSIS — J3089 Other allergic rhinitis: Secondary | ICD-10-CM | POA: Diagnosis not present

## 2018-06-16 DIAGNOSIS — J301 Allergic rhinitis due to pollen: Secondary | ICD-10-CM | POA: Diagnosis not present

## 2018-06-16 DIAGNOSIS — J3089 Other allergic rhinitis: Secondary | ICD-10-CM | POA: Diagnosis not present

## 2018-06-18 DIAGNOSIS — J301 Allergic rhinitis due to pollen: Secondary | ICD-10-CM | POA: Diagnosis not present

## 2018-06-18 DIAGNOSIS — J3089 Other allergic rhinitis: Secondary | ICD-10-CM | POA: Diagnosis not present

## 2018-06-23 DIAGNOSIS — J301 Allergic rhinitis due to pollen: Secondary | ICD-10-CM | POA: Diagnosis not present

## 2018-06-23 DIAGNOSIS — J3089 Other allergic rhinitis: Secondary | ICD-10-CM | POA: Diagnosis not present

## 2018-06-30 DIAGNOSIS — J3089 Other allergic rhinitis: Secondary | ICD-10-CM | POA: Diagnosis not present

## 2018-06-30 DIAGNOSIS — J301 Allergic rhinitis due to pollen: Secondary | ICD-10-CM | POA: Diagnosis not present

## 2018-07-07 DIAGNOSIS — J301 Allergic rhinitis due to pollen: Secondary | ICD-10-CM | POA: Diagnosis not present

## 2018-07-07 DIAGNOSIS — J3089 Other allergic rhinitis: Secondary | ICD-10-CM | POA: Diagnosis not present

## 2018-07-14 DIAGNOSIS — J3089 Other allergic rhinitis: Secondary | ICD-10-CM | POA: Diagnosis not present

## 2018-07-21 DIAGNOSIS — J3089 Other allergic rhinitis: Secondary | ICD-10-CM | POA: Diagnosis not present

## 2018-07-21 DIAGNOSIS — J301 Allergic rhinitis due to pollen: Secondary | ICD-10-CM | POA: Diagnosis not present

## 2018-07-23 DIAGNOSIS — J301 Allergic rhinitis due to pollen: Secondary | ICD-10-CM | POA: Diagnosis not present

## 2018-07-23 DIAGNOSIS — J3089 Other allergic rhinitis: Secondary | ICD-10-CM | POA: Diagnosis not present

## 2018-07-26 DIAGNOSIS — J3089 Other allergic rhinitis: Secondary | ICD-10-CM | POA: Diagnosis not present

## 2018-07-26 DIAGNOSIS — J301 Allergic rhinitis due to pollen: Secondary | ICD-10-CM | POA: Diagnosis not present

## 2018-07-26 DIAGNOSIS — Z803 Family history of malignant neoplasm of breast: Secondary | ICD-10-CM | POA: Diagnosis not present

## 2018-07-26 DIAGNOSIS — Z1231 Encounter for screening mammogram for malignant neoplasm of breast: Secondary | ICD-10-CM | POA: Diagnosis not present

## 2018-07-28 DIAGNOSIS — J3089 Other allergic rhinitis: Secondary | ICD-10-CM | POA: Diagnosis not present

## 2018-07-28 DIAGNOSIS — J301 Allergic rhinitis due to pollen: Secondary | ICD-10-CM | POA: Diagnosis not present

## 2018-08-04 DIAGNOSIS — J301 Allergic rhinitis due to pollen: Secondary | ICD-10-CM | POA: Diagnosis not present

## 2018-08-04 DIAGNOSIS — J3089 Other allergic rhinitis: Secondary | ICD-10-CM | POA: Diagnosis not present

## 2018-08-06 DIAGNOSIS — H25813 Combined forms of age-related cataract, bilateral: Secondary | ICD-10-CM | POA: Diagnosis not present

## 2018-08-06 DIAGNOSIS — H353131 Nonexudative age-related macular degeneration, bilateral, early dry stage: Secondary | ICD-10-CM | POA: Diagnosis not present

## 2018-08-06 DIAGNOSIS — E119 Type 2 diabetes mellitus without complications: Secondary | ICD-10-CM | POA: Diagnosis not present

## 2018-08-06 DIAGNOSIS — H40033 Anatomical narrow angle, bilateral: Secondary | ICD-10-CM | POA: Diagnosis not present

## 2018-08-11 DIAGNOSIS — E1169 Type 2 diabetes mellitus with other specified complication: Secondary | ICD-10-CM | POA: Diagnosis not present

## 2018-08-11 DIAGNOSIS — J301 Allergic rhinitis due to pollen: Secondary | ICD-10-CM | POA: Diagnosis not present

## 2018-08-11 DIAGNOSIS — J3089 Other allergic rhinitis: Secondary | ICD-10-CM | POA: Diagnosis not present

## 2018-08-11 DIAGNOSIS — M810 Age-related osteoporosis without current pathological fracture: Secondary | ICD-10-CM | POA: Diagnosis not present

## 2018-08-11 DIAGNOSIS — E7849 Other hyperlipidemia: Secondary | ICD-10-CM | POA: Diagnosis not present

## 2018-08-11 DIAGNOSIS — I10 Essential (primary) hypertension: Secondary | ICD-10-CM | POA: Diagnosis not present

## 2018-08-16 DIAGNOSIS — M810 Age-related osteoporosis without current pathological fracture: Secondary | ICD-10-CM | POA: Diagnosis not present

## 2018-08-16 DIAGNOSIS — I10 Essential (primary) hypertension: Secondary | ICD-10-CM | POA: Diagnosis not present

## 2018-08-16 DIAGNOSIS — L309 Dermatitis, unspecified: Secondary | ICD-10-CM | POA: Diagnosis not present

## 2018-08-16 DIAGNOSIS — M1990 Unspecified osteoarthritis, unspecified site: Secondary | ICD-10-CM | POA: Diagnosis not present

## 2018-08-16 DIAGNOSIS — Z0000 Encounter for general adult medical examination without abnormal findings: Secondary | ICD-10-CM | POA: Diagnosis not present

## 2018-08-16 DIAGNOSIS — E1169 Type 2 diabetes mellitus with other specified complication: Secondary | ICD-10-CM | POA: Diagnosis not present

## 2018-08-16 DIAGNOSIS — Z6828 Body mass index (BMI) 28.0-28.9, adult: Secondary | ICD-10-CM | POA: Diagnosis not present

## 2018-08-16 DIAGNOSIS — J302 Other seasonal allergic rhinitis: Secondary | ICD-10-CM | POA: Diagnosis not present

## 2018-08-16 DIAGNOSIS — E7849 Other hyperlipidemia: Secondary | ICD-10-CM | POA: Diagnosis not present

## 2018-08-16 DIAGNOSIS — E663 Overweight: Secondary | ICD-10-CM | POA: Diagnosis not present

## 2018-08-17 DIAGNOSIS — Z1212 Encounter for screening for malignant neoplasm of rectum: Secondary | ICD-10-CM | POA: Diagnosis not present

## 2018-08-18 DIAGNOSIS — J301 Allergic rhinitis due to pollen: Secondary | ICD-10-CM | POA: Diagnosis not present

## 2018-08-18 DIAGNOSIS — J3089 Other allergic rhinitis: Secondary | ICD-10-CM | POA: Diagnosis not present

## 2018-08-25 DIAGNOSIS — J3089 Other allergic rhinitis: Secondary | ICD-10-CM | POA: Diagnosis not present

## 2018-08-25 DIAGNOSIS — J301 Allergic rhinitis due to pollen: Secondary | ICD-10-CM | POA: Diagnosis not present

## 2018-09-01 DIAGNOSIS — J301 Allergic rhinitis due to pollen: Secondary | ICD-10-CM | POA: Diagnosis not present

## 2018-09-01 DIAGNOSIS — J3089 Other allergic rhinitis: Secondary | ICD-10-CM | POA: Diagnosis not present

## 2018-09-08 DIAGNOSIS — J3089 Other allergic rhinitis: Secondary | ICD-10-CM | POA: Diagnosis not present

## 2018-09-08 DIAGNOSIS — J301 Allergic rhinitis due to pollen: Secondary | ICD-10-CM | POA: Diagnosis not present

## 2018-09-13 DIAGNOSIS — J3089 Other allergic rhinitis: Secondary | ICD-10-CM | POA: Diagnosis not present

## 2018-09-13 DIAGNOSIS — J301 Allergic rhinitis due to pollen: Secondary | ICD-10-CM | POA: Diagnosis not present

## 2018-09-24 DIAGNOSIS — J3089 Other allergic rhinitis: Secondary | ICD-10-CM | POA: Diagnosis not present

## 2018-09-24 DIAGNOSIS — J301 Allergic rhinitis due to pollen: Secondary | ICD-10-CM | POA: Diagnosis not present

## 2018-09-29 DIAGNOSIS — J301 Allergic rhinitis due to pollen: Secondary | ICD-10-CM | POA: Diagnosis not present

## 2018-09-29 DIAGNOSIS — J3089 Other allergic rhinitis: Secondary | ICD-10-CM | POA: Diagnosis not present

## 2018-10-06 DIAGNOSIS — J3089 Other allergic rhinitis: Secondary | ICD-10-CM | POA: Diagnosis not present

## 2018-10-06 DIAGNOSIS — J301 Allergic rhinitis due to pollen: Secondary | ICD-10-CM | POA: Diagnosis not present

## 2018-10-13 DIAGNOSIS — J301 Allergic rhinitis due to pollen: Secondary | ICD-10-CM | POA: Diagnosis not present

## 2018-10-13 DIAGNOSIS — J3089 Other allergic rhinitis: Secondary | ICD-10-CM | POA: Diagnosis not present

## 2018-10-20 DIAGNOSIS — J3089 Other allergic rhinitis: Secondary | ICD-10-CM | POA: Diagnosis not present

## 2018-10-20 DIAGNOSIS — J301 Allergic rhinitis due to pollen: Secondary | ICD-10-CM | POA: Diagnosis not present

## 2018-10-27 DIAGNOSIS — J3089 Other allergic rhinitis: Secondary | ICD-10-CM | POA: Diagnosis not present

## 2018-10-27 DIAGNOSIS — J301 Allergic rhinitis due to pollen: Secondary | ICD-10-CM | POA: Diagnosis not present

## 2018-11-03 DIAGNOSIS — J3089 Other allergic rhinitis: Secondary | ICD-10-CM | POA: Diagnosis not present

## 2018-11-03 DIAGNOSIS — J301 Allergic rhinitis due to pollen: Secondary | ICD-10-CM | POA: Diagnosis not present

## 2018-11-10 DIAGNOSIS — J301 Allergic rhinitis due to pollen: Secondary | ICD-10-CM | POA: Diagnosis not present

## 2018-11-10 DIAGNOSIS — J3089 Other allergic rhinitis: Secondary | ICD-10-CM | POA: Diagnosis not present

## 2018-11-17 DIAGNOSIS — J3089 Other allergic rhinitis: Secondary | ICD-10-CM | POA: Diagnosis not present

## 2018-11-17 DIAGNOSIS — J301 Allergic rhinitis due to pollen: Secondary | ICD-10-CM | POA: Diagnosis not present

## 2018-11-24 DIAGNOSIS — J301 Allergic rhinitis due to pollen: Secondary | ICD-10-CM | POA: Diagnosis not present

## 2018-11-24 DIAGNOSIS — J3089 Other allergic rhinitis: Secondary | ICD-10-CM | POA: Diagnosis not present

## 2018-11-25 DIAGNOSIS — J3089 Other allergic rhinitis: Secondary | ICD-10-CM | POA: Diagnosis not present

## 2018-11-25 DIAGNOSIS — J301 Allergic rhinitis due to pollen: Secondary | ICD-10-CM | POA: Diagnosis not present

## 2018-12-01 DIAGNOSIS — J3089 Other allergic rhinitis: Secondary | ICD-10-CM | POA: Diagnosis not present

## 2018-12-01 DIAGNOSIS — J301 Allergic rhinitis due to pollen: Secondary | ICD-10-CM | POA: Diagnosis not present

## 2018-12-08 DIAGNOSIS — J301 Allergic rhinitis due to pollen: Secondary | ICD-10-CM | POA: Diagnosis not present

## 2018-12-08 DIAGNOSIS — J3089 Other allergic rhinitis: Secondary | ICD-10-CM | POA: Diagnosis not present

## 2018-12-15 DIAGNOSIS — J301 Allergic rhinitis due to pollen: Secondary | ICD-10-CM | POA: Diagnosis not present

## 2018-12-15 DIAGNOSIS — J3089 Other allergic rhinitis: Secondary | ICD-10-CM | POA: Diagnosis not present

## 2018-12-17 DIAGNOSIS — J3089 Other allergic rhinitis: Secondary | ICD-10-CM | POA: Diagnosis not present

## 2018-12-17 DIAGNOSIS — J301 Allergic rhinitis due to pollen: Secondary | ICD-10-CM | POA: Diagnosis not present

## 2018-12-20 DIAGNOSIS — J302 Other seasonal allergic rhinitis: Secondary | ICD-10-CM | POA: Diagnosis not present

## 2018-12-20 DIAGNOSIS — Z1331 Encounter for screening for depression: Secondary | ICD-10-CM | POA: Diagnosis not present

## 2018-12-20 DIAGNOSIS — E785 Hyperlipidemia, unspecified: Secondary | ICD-10-CM | POA: Diagnosis not present

## 2018-12-20 DIAGNOSIS — M810 Age-related osteoporosis without current pathological fracture: Secondary | ICD-10-CM | POA: Diagnosis not present

## 2018-12-20 DIAGNOSIS — E663 Overweight: Secondary | ICD-10-CM | POA: Diagnosis not present

## 2018-12-20 DIAGNOSIS — L309 Dermatitis, unspecified: Secondary | ICD-10-CM | POA: Diagnosis not present

## 2018-12-20 DIAGNOSIS — E1169 Type 2 diabetes mellitus with other specified complication: Secondary | ICD-10-CM | POA: Diagnosis not present

## 2018-12-20 DIAGNOSIS — I10 Essential (primary) hypertension: Secondary | ICD-10-CM | POA: Diagnosis not present

## 2018-12-20 DIAGNOSIS — M1990 Unspecified osteoarthritis, unspecified site: Secondary | ICD-10-CM | POA: Diagnosis not present

## 2018-12-21 DIAGNOSIS — J301 Allergic rhinitis due to pollen: Secondary | ICD-10-CM | POA: Diagnosis not present

## 2018-12-21 DIAGNOSIS — J3089 Other allergic rhinitis: Secondary | ICD-10-CM | POA: Diagnosis not present

## 2018-12-24 DIAGNOSIS — J301 Allergic rhinitis due to pollen: Secondary | ICD-10-CM | POA: Diagnosis not present

## 2018-12-24 DIAGNOSIS — J3089 Other allergic rhinitis: Secondary | ICD-10-CM | POA: Diagnosis not present

## 2018-12-29 DIAGNOSIS — J3089 Other allergic rhinitis: Secondary | ICD-10-CM | POA: Diagnosis not present

## 2018-12-29 DIAGNOSIS — J301 Allergic rhinitis due to pollen: Secondary | ICD-10-CM | POA: Diagnosis not present

## 2019-01-05 DIAGNOSIS — J301 Allergic rhinitis due to pollen: Secondary | ICD-10-CM | POA: Diagnosis not present

## 2019-01-05 DIAGNOSIS — J3089 Other allergic rhinitis: Secondary | ICD-10-CM | POA: Diagnosis not present

## 2019-01-11 DIAGNOSIS — J3089 Other allergic rhinitis: Secondary | ICD-10-CM | POA: Diagnosis not present

## 2019-01-11 DIAGNOSIS — J301 Allergic rhinitis due to pollen: Secondary | ICD-10-CM | POA: Diagnosis not present

## 2019-01-18 DIAGNOSIS — J3089 Other allergic rhinitis: Secondary | ICD-10-CM | POA: Diagnosis not present

## 2019-01-18 DIAGNOSIS — J301 Allergic rhinitis due to pollen: Secondary | ICD-10-CM | POA: Diagnosis not present

## 2019-01-25 DIAGNOSIS — J301 Allergic rhinitis due to pollen: Secondary | ICD-10-CM | POA: Diagnosis not present

## 2019-01-25 DIAGNOSIS — J3089 Other allergic rhinitis: Secondary | ICD-10-CM | POA: Diagnosis not present

## 2019-02-01 DIAGNOSIS — J301 Allergic rhinitis due to pollen: Secondary | ICD-10-CM | POA: Diagnosis not present

## 2019-02-01 DIAGNOSIS — J3089 Other allergic rhinitis: Secondary | ICD-10-CM | POA: Diagnosis not present

## 2019-02-08 DIAGNOSIS — J3089 Other allergic rhinitis: Secondary | ICD-10-CM | POA: Diagnosis not present

## 2019-02-08 DIAGNOSIS — J301 Allergic rhinitis due to pollen: Secondary | ICD-10-CM | POA: Diagnosis not present

## 2019-02-15 DIAGNOSIS — J301 Allergic rhinitis due to pollen: Secondary | ICD-10-CM | POA: Diagnosis not present

## 2019-02-15 DIAGNOSIS — J3089 Other allergic rhinitis: Secondary | ICD-10-CM | POA: Diagnosis not present

## 2019-02-22 DIAGNOSIS — J3089 Other allergic rhinitis: Secondary | ICD-10-CM | POA: Diagnosis not present

## 2019-02-22 DIAGNOSIS — J301 Allergic rhinitis due to pollen: Secondary | ICD-10-CM | POA: Diagnosis not present

## 2019-03-01 DIAGNOSIS — J3089 Other allergic rhinitis: Secondary | ICD-10-CM | POA: Diagnosis not present

## 2019-03-01 DIAGNOSIS — J301 Allergic rhinitis due to pollen: Secondary | ICD-10-CM | POA: Diagnosis not present

## 2019-03-08 DIAGNOSIS — J3089 Other allergic rhinitis: Secondary | ICD-10-CM | POA: Diagnosis not present

## 2019-03-08 DIAGNOSIS — J301 Allergic rhinitis due to pollen: Secondary | ICD-10-CM | POA: Diagnosis not present

## 2019-03-15 DIAGNOSIS — J301 Allergic rhinitis due to pollen: Secondary | ICD-10-CM | POA: Diagnosis not present

## 2019-03-15 DIAGNOSIS — J3089 Other allergic rhinitis: Secondary | ICD-10-CM | POA: Diagnosis not present

## 2019-03-22 DIAGNOSIS — J3089 Other allergic rhinitis: Secondary | ICD-10-CM | POA: Diagnosis not present

## 2019-03-22 DIAGNOSIS — J301 Allergic rhinitis due to pollen: Secondary | ICD-10-CM | POA: Diagnosis not present

## 2019-03-29 DIAGNOSIS — J3089 Other allergic rhinitis: Secondary | ICD-10-CM | POA: Diagnosis not present

## 2019-03-29 DIAGNOSIS — J301 Allergic rhinitis due to pollen: Secondary | ICD-10-CM | POA: Diagnosis not present

## 2019-04-05 DIAGNOSIS — J301 Allergic rhinitis due to pollen: Secondary | ICD-10-CM | POA: Diagnosis not present

## 2019-04-05 DIAGNOSIS — J3089 Other allergic rhinitis: Secondary | ICD-10-CM | POA: Diagnosis not present

## 2019-04-12 DIAGNOSIS — J301 Allergic rhinitis due to pollen: Secondary | ICD-10-CM | POA: Diagnosis not present

## 2019-04-12 DIAGNOSIS — J3089 Other allergic rhinitis: Secondary | ICD-10-CM | POA: Diagnosis not present

## 2019-04-14 DIAGNOSIS — J301 Allergic rhinitis due to pollen: Secondary | ICD-10-CM | POA: Diagnosis not present

## 2019-04-14 DIAGNOSIS — J3089 Other allergic rhinitis: Secondary | ICD-10-CM | POA: Diagnosis not present

## 2019-04-19 DIAGNOSIS — J301 Allergic rhinitis due to pollen: Secondary | ICD-10-CM | POA: Diagnosis not present

## 2019-04-19 DIAGNOSIS — J3089 Other allergic rhinitis: Secondary | ICD-10-CM | POA: Diagnosis not present

## 2019-04-26 DIAGNOSIS — J3089 Other allergic rhinitis: Secondary | ICD-10-CM | POA: Diagnosis not present

## 2019-04-26 DIAGNOSIS — J301 Allergic rhinitis due to pollen: Secondary | ICD-10-CM | POA: Diagnosis not present

## 2019-05-03 DIAGNOSIS — J301 Allergic rhinitis due to pollen: Secondary | ICD-10-CM | POA: Diagnosis not present

## 2019-05-03 DIAGNOSIS — J3089 Other allergic rhinitis: Secondary | ICD-10-CM | POA: Diagnosis not present

## 2019-05-10 DIAGNOSIS — J3089 Other allergic rhinitis: Secondary | ICD-10-CM | POA: Diagnosis not present

## 2019-05-10 DIAGNOSIS — J301 Allergic rhinitis due to pollen: Secondary | ICD-10-CM | POA: Diagnosis not present

## 2019-05-11 DIAGNOSIS — M1990 Unspecified osteoarthritis, unspecified site: Secondary | ICD-10-CM | POA: Diagnosis not present

## 2019-05-11 DIAGNOSIS — E785 Hyperlipidemia, unspecified: Secondary | ICD-10-CM | POA: Diagnosis not present

## 2019-05-11 DIAGNOSIS — J302 Other seasonal allergic rhinitis: Secondary | ICD-10-CM | POA: Diagnosis not present

## 2019-05-11 DIAGNOSIS — E1169 Type 2 diabetes mellitus with other specified complication: Secondary | ICD-10-CM | POA: Diagnosis not present

## 2019-05-11 DIAGNOSIS — I10 Essential (primary) hypertension: Secondary | ICD-10-CM | POA: Diagnosis not present

## 2019-05-11 DIAGNOSIS — L309 Dermatitis, unspecified: Secondary | ICD-10-CM | POA: Diagnosis not present

## 2019-05-11 DIAGNOSIS — M810 Age-related osteoporosis without current pathological fracture: Secondary | ICD-10-CM | POA: Diagnosis not present

## 2019-05-11 DIAGNOSIS — E663 Overweight: Secondary | ICD-10-CM | POA: Diagnosis not present

## 2019-05-13 DIAGNOSIS — J301 Allergic rhinitis due to pollen: Secondary | ICD-10-CM | POA: Diagnosis not present

## 2019-05-13 DIAGNOSIS — J3089 Other allergic rhinitis: Secondary | ICD-10-CM | POA: Diagnosis not present

## 2019-05-16 DIAGNOSIS — T63441D Toxic effect of venom of bees, accidental (unintentional), subsequent encounter: Secondary | ICD-10-CM | POA: Diagnosis not present

## 2019-05-16 DIAGNOSIS — J3089 Other allergic rhinitis: Secondary | ICD-10-CM | POA: Diagnosis not present

## 2019-05-16 DIAGNOSIS — J3081 Allergic rhinitis due to animal (cat) (dog) hair and dander: Secondary | ICD-10-CM | POA: Diagnosis not present

## 2019-05-16 DIAGNOSIS — J301 Allergic rhinitis due to pollen: Secondary | ICD-10-CM | POA: Diagnosis not present

## 2019-05-17 DIAGNOSIS — J3089 Other allergic rhinitis: Secondary | ICD-10-CM | POA: Diagnosis not present

## 2019-05-17 DIAGNOSIS — J301 Allergic rhinitis due to pollen: Secondary | ICD-10-CM | POA: Diagnosis not present

## 2019-05-20 DIAGNOSIS — J301 Allergic rhinitis due to pollen: Secondary | ICD-10-CM | POA: Diagnosis not present

## 2019-05-20 DIAGNOSIS — J3089 Other allergic rhinitis: Secondary | ICD-10-CM | POA: Diagnosis not present

## 2019-05-24 DIAGNOSIS — J3089 Other allergic rhinitis: Secondary | ICD-10-CM | POA: Diagnosis not present

## 2019-05-24 DIAGNOSIS — J301 Allergic rhinitis due to pollen: Secondary | ICD-10-CM | POA: Diagnosis not present

## 2019-05-27 DIAGNOSIS — Z23 Encounter for immunization: Secondary | ICD-10-CM | POA: Diagnosis not present

## 2019-05-31 DIAGNOSIS — J301 Allergic rhinitis due to pollen: Secondary | ICD-10-CM | POA: Diagnosis not present

## 2019-05-31 DIAGNOSIS — J3089 Other allergic rhinitis: Secondary | ICD-10-CM | POA: Diagnosis not present

## 2019-06-07 DIAGNOSIS — J3089 Other allergic rhinitis: Secondary | ICD-10-CM | POA: Diagnosis not present

## 2019-06-07 DIAGNOSIS — J301 Allergic rhinitis due to pollen: Secondary | ICD-10-CM | POA: Diagnosis not present

## 2019-06-14 DIAGNOSIS — J3089 Other allergic rhinitis: Secondary | ICD-10-CM | POA: Diagnosis not present

## 2019-06-14 DIAGNOSIS — J301 Allergic rhinitis due to pollen: Secondary | ICD-10-CM | POA: Diagnosis not present

## 2019-06-21 DIAGNOSIS — J3089 Other allergic rhinitis: Secondary | ICD-10-CM | POA: Diagnosis not present

## 2019-06-21 DIAGNOSIS — J301 Allergic rhinitis due to pollen: Secondary | ICD-10-CM | POA: Diagnosis not present

## 2019-06-28 DIAGNOSIS — J301 Allergic rhinitis due to pollen: Secondary | ICD-10-CM | POA: Diagnosis not present

## 2019-06-28 DIAGNOSIS — J3089 Other allergic rhinitis: Secondary | ICD-10-CM | POA: Diagnosis not present

## 2019-07-05 DIAGNOSIS — J3089 Other allergic rhinitis: Secondary | ICD-10-CM | POA: Diagnosis not present

## 2019-07-05 DIAGNOSIS — J301 Allergic rhinitis due to pollen: Secondary | ICD-10-CM | POA: Diagnosis not present

## 2019-07-12 DIAGNOSIS — J3089 Other allergic rhinitis: Secondary | ICD-10-CM | POA: Diagnosis not present

## 2019-07-12 DIAGNOSIS — J301 Allergic rhinitis due to pollen: Secondary | ICD-10-CM | POA: Diagnosis not present

## 2019-07-19 DIAGNOSIS — J3089 Other allergic rhinitis: Secondary | ICD-10-CM | POA: Diagnosis not present

## 2019-07-19 DIAGNOSIS — J301 Allergic rhinitis due to pollen: Secondary | ICD-10-CM | POA: Diagnosis not present

## 2019-07-26 DIAGNOSIS — J301 Allergic rhinitis due to pollen: Secondary | ICD-10-CM | POA: Diagnosis not present

## 2019-07-26 DIAGNOSIS — J3089 Other allergic rhinitis: Secondary | ICD-10-CM | POA: Diagnosis not present

## 2019-08-01 DIAGNOSIS — Z803 Family history of malignant neoplasm of breast: Secondary | ICD-10-CM | POA: Diagnosis not present

## 2019-08-01 DIAGNOSIS — Z1231 Encounter for screening mammogram for malignant neoplasm of breast: Secondary | ICD-10-CM | POA: Diagnosis not present

## 2019-08-02 DIAGNOSIS — J301 Allergic rhinitis due to pollen: Secondary | ICD-10-CM | POA: Diagnosis not present

## 2019-08-02 DIAGNOSIS — J3089 Other allergic rhinitis: Secondary | ICD-10-CM | POA: Diagnosis not present

## 2019-08-09 DIAGNOSIS — J3089 Other allergic rhinitis: Secondary | ICD-10-CM | POA: Diagnosis not present

## 2019-08-09 DIAGNOSIS — J301 Allergic rhinitis due to pollen: Secondary | ICD-10-CM | POA: Diagnosis not present

## 2019-08-15 DIAGNOSIS — M810 Age-related osteoporosis without current pathological fracture: Secondary | ICD-10-CM | POA: Diagnosis not present

## 2019-08-15 DIAGNOSIS — E7849 Other hyperlipidemia: Secondary | ICD-10-CM | POA: Diagnosis not present

## 2019-08-15 DIAGNOSIS — E1169 Type 2 diabetes mellitus with other specified complication: Secondary | ICD-10-CM | POA: Diagnosis not present

## 2019-08-16 DIAGNOSIS — J301 Allergic rhinitis due to pollen: Secondary | ICD-10-CM | POA: Diagnosis not present

## 2019-08-16 DIAGNOSIS — R82998 Other abnormal findings in urine: Secondary | ICD-10-CM | POA: Diagnosis not present

## 2019-08-16 DIAGNOSIS — I10 Essential (primary) hypertension: Secondary | ICD-10-CM | POA: Diagnosis not present

## 2019-08-16 DIAGNOSIS — J3089 Other allergic rhinitis: Secondary | ICD-10-CM | POA: Diagnosis not present

## 2019-08-22 DIAGNOSIS — L309 Dermatitis, unspecified: Secondary | ICD-10-CM | POA: Diagnosis not present

## 2019-08-22 DIAGNOSIS — Z0000 Encounter for general adult medical examination without abnormal findings: Secondary | ICD-10-CM | POA: Diagnosis not present

## 2019-08-22 DIAGNOSIS — Z1339 Encounter for screening examination for other mental health and behavioral disorders: Secondary | ICD-10-CM | POA: Diagnosis not present

## 2019-08-22 DIAGNOSIS — M1990 Unspecified osteoarthritis, unspecified site: Secondary | ICD-10-CM | POA: Diagnosis not present

## 2019-08-22 DIAGNOSIS — E663 Overweight: Secondary | ICD-10-CM | POA: Diagnosis not present

## 2019-08-22 DIAGNOSIS — E785 Hyperlipidemia, unspecified: Secondary | ICD-10-CM | POA: Diagnosis not present

## 2019-08-22 DIAGNOSIS — J302 Other seasonal allergic rhinitis: Secondary | ICD-10-CM | POA: Diagnosis not present

## 2019-08-22 DIAGNOSIS — M810 Age-related osteoporosis without current pathological fracture: Secondary | ICD-10-CM | POA: Diagnosis not present

## 2019-08-22 DIAGNOSIS — E1169 Type 2 diabetes mellitus with other specified complication: Secondary | ICD-10-CM | POA: Diagnosis not present

## 2019-08-22 DIAGNOSIS — I10 Essential (primary) hypertension: Secondary | ICD-10-CM | POA: Diagnosis not present

## 2019-08-23 DIAGNOSIS — J301 Allergic rhinitis due to pollen: Secondary | ICD-10-CM | POA: Diagnosis not present

## 2019-08-23 DIAGNOSIS — J3089 Other allergic rhinitis: Secondary | ICD-10-CM | POA: Diagnosis not present

## 2019-10-04 DIAGNOSIS — J301 Allergic rhinitis due to pollen: Secondary | ICD-10-CM | POA: Diagnosis not present

## 2019-10-04 DIAGNOSIS — J3089 Other allergic rhinitis: Secondary | ICD-10-CM | POA: Diagnosis not present

## 2019-10-07 DIAGNOSIS — J301 Allergic rhinitis due to pollen: Secondary | ICD-10-CM | POA: Diagnosis not present

## 2019-10-07 DIAGNOSIS — J3089 Other allergic rhinitis: Secondary | ICD-10-CM | POA: Diagnosis not present

## 2019-10-11 DIAGNOSIS — J301 Allergic rhinitis due to pollen: Secondary | ICD-10-CM | POA: Diagnosis not present

## 2019-10-11 DIAGNOSIS — J3089 Other allergic rhinitis: Secondary | ICD-10-CM | POA: Diagnosis not present

## 2019-10-14 DIAGNOSIS — J3089 Other allergic rhinitis: Secondary | ICD-10-CM | POA: Diagnosis not present

## 2019-10-14 DIAGNOSIS — J301 Allergic rhinitis due to pollen: Secondary | ICD-10-CM | POA: Diagnosis not present

## 2019-10-18 DIAGNOSIS — J301 Allergic rhinitis due to pollen: Secondary | ICD-10-CM | POA: Diagnosis not present

## 2019-10-18 DIAGNOSIS — J3089 Other allergic rhinitis: Secondary | ICD-10-CM | POA: Diagnosis not present

## 2019-10-25 DIAGNOSIS — J301 Allergic rhinitis due to pollen: Secondary | ICD-10-CM | POA: Diagnosis not present

## 2019-10-25 DIAGNOSIS — J3089 Other allergic rhinitis: Secondary | ICD-10-CM | POA: Diagnosis not present

## 2019-11-01 DIAGNOSIS — J301 Allergic rhinitis due to pollen: Secondary | ICD-10-CM | POA: Diagnosis not present

## 2019-11-01 DIAGNOSIS — J3089 Other allergic rhinitis: Secondary | ICD-10-CM | POA: Diagnosis not present

## 2019-11-08 DIAGNOSIS — J3089 Other allergic rhinitis: Secondary | ICD-10-CM | POA: Diagnosis not present

## 2019-11-08 DIAGNOSIS — J301 Allergic rhinitis due to pollen: Secondary | ICD-10-CM | POA: Diagnosis not present

## 2019-11-15 DIAGNOSIS — J3089 Other allergic rhinitis: Secondary | ICD-10-CM | POA: Diagnosis not present

## 2019-11-15 DIAGNOSIS — J301 Allergic rhinitis due to pollen: Secondary | ICD-10-CM | POA: Diagnosis not present

## 2019-11-18 DIAGNOSIS — H40033 Anatomical narrow angle, bilateral: Secondary | ICD-10-CM | POA: Diagnosis not present

## 2019-11-18 DIAGNOSIS — H25813 Combined forms of age-related cataract, bilateral: Secondary | ICD-10-CM | POA: Diagnosis not present

## 2019-11-18 DIAGNOSIS — E119 Type 2 diabetes mellitus without complications: Secondary | ICD-10-CM | POA: Diagnosis not present

## 2019-11-18 DIAGNOSIS — H5203 Hypermetropia, bilateral: Secondary | ICD-10-CM | POA: Diagnosis not present

## 2019-11-20 ENCOUNTER — Ambulatory Visit: Payer: Medicare Other

## 2019-11-22 DIAGNOSIS — E1169 Type 2 diabetes mellitus with other specified complication: Secondary | ICD-10-CM | POA: Diagnosis not present

## 2019-11-22 DIAGNOSIS — R509 Fever, unspecified: Secondary | ICD-10-CM | POA: Diagnosis not present

## 2019-11-22 DIAGNOSIS — J1282 Pneumonia due to coronavirus disease 2019: Secondary | ICD-10-CM | POA: Diagnosis not present

## 2019-11-22 DIAGNOSIS — Z20818 Contact with and (suspected) exposure to other bacterial communicable diseases: Secondary | ICD-10-CM | POA: Diagnosis not present

## 2019-11-22 DIAGNOSIS — R05 Cough: Secondary | ICD-10-CM | POA: Diagnosis not present

## 2019-11-22 DIAGNOSIS — E785 Hyperlipidemia, unspecified: Secondary | ICD-10-CM | POA: Diagnosis not present

## 2019-11-22 DIAGNOSIS — I10 Essential (primary) hypertension: Secondary | ICD-10-CM | POA: Diagnosis not present

## 2019-11-23 ENCOUNTER — Other Ambulatory Visit: Payer: Self-pay | Admitting: Nurse Practitioner

## 2019-11-23 DIAGNOSIS — U071 COVID-19: Secondary | ICD-10-CM

## 2019-11-23 NOTE — Progress Notes (Signed)
  I connected by phone with Kelsey Carpenter on 11/23/2019 at 8:22 AM to discuss the potential use of an new treatment for mild to moderate COVID-19 viral infection in non-hospitalized patients.  This patient is a 73 y.o. female that meets the FDA criteria for Emergency Use Authorization of bamlanivimab or casirivimab\imdevimab.  Has a (+) direct SARS-CoV-2 viral test result  Has mild or moderate COVID-19   Is ? 72 years of age and weighs ? 40 kg  Is NOT hospitalized due to COVID-19  Is NOT requiring oxygen therapy or requiring an increase in baseline oxygen flow rate due to COVID-19  Is within 10 days of symptom onset  Has at least one of the high risk factor(s) for progression to severe COVID-19 and/or hospitalization as defined in EUA.  Specific high risk criteria : >/= 72 yo   I have spoken and communicated the following to the patient or parent/caregiver:  1. FDA has authorized the emergency use of bamlanivimab and casirivimab\imdevimab for the treatment of mild to moderate COVID-19 in adults and pediatric patients with positive results of direct SARS-CoV-2 viral testing who are 67 years of age and older weighing at least 40 kg, and who are at high risk for progressing to severe COVID-19 and/or hospitalization.  2. The significant known and potential risks and benefits of bamlanivimab and casirivimab\imdevimab, and the extent to which such potential risks and benefits are unknown.  3. Information on available alternative treatments and the risks and benefits of those alternatives, including clinical trials.  4. Patients treated with bamlanivimab and casirivimab\imdevimab should continue to self-isolate and use infection control measures (e.g., wear mask, isolate, social distance, avoid sharing personal items, clean and disinfect "high touch" surfaces, and frequent handwashing) according to CDC guidelines.   5. The patient or parent/caregiver has the option to accept or refuse  bamlanivimab or casirivimab\imdevimab .  After reviewing this information with the patient, The patient agreed to proceed with receiving the bamlanimivab infusion and will be provided a copy of the Fact sheet prior to receiving the infusion.Fenton Foy 11/23/2019 8:22 AM

## 2019-11-25 ENCOUNTER — Ambulatory Visit (HOSPITAL_COMMUNITY)
Admission: RE | Admit: 2019-11-25 | Discharge: 2019-11-25 | Disposition: A | Discharge status: Home / Self Care | Payer: Medicare Other | Source: Ambulatory Visit | Attending: Pulmonary Disease | Admitting: Pulmonary Disease

## 2019-11-25 DIAGNOSIS — U071 COVID-19: Secondary | ICD-10-CM | POA: Diagnosis not present

## 2019-11-25 DIAGNOSIS — Z23 Encounter for immunization: Secondary | ICD-10-CM | POA: Diagnosis not present

## 2019-11-25 MED ORDER — ALBUTEROL SULFATE HFA 108 (90 BASE) MCG/ACT IN AERS: 2.0000 | INHALATION_SPRAY | Freq: Once | RESPIRATORY_TRACT | Status: DC | PRN

## 2019-11-25 MED ORDER — SODIUM CHLORIDE 0.9 % IV SOLN
700.0000 mg | Freq: Once | INTRAVENOUS | Status: AC
  Administered 2019-11-25: 700 mg via INTRAVENOUS
  Filled 2019-11-25: qty 20

## 2019-11-25 MED ORDER — EPINEPHRINE 0.3 MG/0.3ML IJ SOAJ: 0.3000 mg | Freq: Once | INTRAMUSCULAR | Status: DC | PRN

## 2019-11-25 MED ORDER — METHYLPREDNISOLONE SODIUM SUCC 125 MG IJ SOLR: 125.0000 mg | Freq: Once | INTRAMUSCULAR | Status: DC | PRN

## 2019-11-25 MED ORDER — DIPHENHYDRAMINE HCL 50 MG/ML IJ SOLN: 50.0000 mg | Freq: Once | INTRAMUSCULAR | Status: DC | PRN

## 2019-11-25 MED ORDER — FAMOTIDINE IN NACL 20-0.9 MG/50ML-% IV SOLN: 20.0000 mg | Freq: Once | INTRAVENOUS | Status: DC | PRN

## 2019-11-25 MED ORDER — SODIUM CHLORIDE 0.9 % IV SOLN: INTRAVENOUS | Status: DC | PRN

## 2019-11-25 NOTE — Progress Notes (Signed)
  Diagnosis: COVID-19  Physician: Dr. Joya Gaskins  Procedure: [facility] Med: bamlanivimab infusion - Provided patient with bamlanimivab fact sheet for patients, parents and caregivers prior to infusion.  Complications: No immediate complications noted.  Discharge: Discharged home   Acquanetta Chain 11/25/2019

## 2019-11-25 NOTE — Discharge Instructions (Signed)

## 2019-11-27 ENCOUNTER — Ambulatory Visit: Payer: Medicare Other

## 2019-12-09 DIAGNOSIS — J3089 Other allergic rhinitis: Secondary | ICD-10-CM | POA: Diagnosis not present

## 2019-12-09 DIAGNOSIS — J301 Allergic rhinitis due to pollen: Secondary | ICD-10-CM | POA: Diagnosis not present

## 2019-12-14 DIAGNOSIS — J301 Allergic rhinitis due to pollen: Secondary | ICD-10-CM | POA: Diagnosis not present

## 2019-12-14 DIAGNOSIS — J3089 Other allergic rhinitis: Secondary | ICD-10-CM | POA: Diagnosis not present

## 2019-12-21 DIAGNOSIS — J301 Allergic rhinitis due to pollen: Secondary | ICD-10-CM | POA: Diagnosis not present

## 2019-12-21 DIAGNOSIS — J3089 Other allergic rhinitis: Secondary | ICD-10-CM | POA: Diagnosis not present

## 2019-12-26 DIAGNOSIS — E1169 Type 2 diabetes mellitus with other specified complication: Secondary | ICD-10-CM | POA: Diagnosis not present

## 2019-12-26 DIAGNOSIS — M1990 Unspecified osteoarthritis, unspecified site: Secondary | ICD-10-CM | POA: Diagnosis not present

## 2019-12-26 DIAGNOSIS — I10 Essential (primary) hypertension: Secondary | ICD-10-CM | POA: Diagnosis not present

## 2019-12-26 DIAGNOSIS — E663 Overweight: Secondary | ICD-10-CM | POA: Diagnosis not present

## 2019-12-27 DIAGNOSIS — J3081 Allergic rhinitis due to animal (cat) (dog) hair and dander: Secondary | ICD-10-CM | POA: Diagnosis not present

## 2019-12-27 DIAGNOSIS — J3089 Other allergic rhinitis: Secondary | ICD-10-CM | POA: Diagnosis not present

## 2019-12-27 DIAGNOSIS — J301 Allergic rhinitis due to pollen: Secondary | ICD-10-CM | POA: Diagnosis not present

## 2020-01-03 DIAGNOSIS — J301 Allergic rhinitis due to pollen: Secondary | ICD-10-CM | POA: Diagnosis not present

## 2020-01-03 DIAGNOSIS — J3089 Other allergic rhinitis: Secondary | ICD-10-CM | POA: Diagnosis not present

## 2020-01-10 DIAGNOSIS — J301 Allergic rhinitis due to pollen: Secondary | ICD-10-CM | POA: Diagnosis not present

## 2020-01-10 DIAGNOSIS — J3089 Other allergic rhinitis: Secondary | ICD-10-CM | POA: Diagnosis not present

## 2020-01-17 DIAGNOSIS — J3089 Other allergic rhinitis: Secondary | ICD-10-CM | POA: Diagnosis not present

## 2020-01-17 DIAGNOSIS — J301 Allergic rhinitis due to pollen: Secondary | ICD-10-CM | POA: Diagnosis not present

## 2020-01-24 DIAGNOSIS — J301 Allergic rhinitis due to pollen: Secondary | ICD-10-CM | POA: Diagnosis not present

## 2020-01-24 DIAGNOSIS — J3089 Other allergic rhinitis: Secondary | ICD-10-CM | POA: Diagnosis not present

## 2020-02-01 DIAGNOSIS — J301 Allergic rhinitis due to pollen: Secondary | ICD-10-CM | POA: Diagnosis not present

## 2020-02-01 DIAGNOSIS — J3089 Other allergic rhinitis: Secondary | ICD-10-CM | POA: Diagnosis not present

## 2020-02-07 DIAGNOSIS — J301 Allergic rhinitis due to pollen: Secondary | ICD-10-CM | POA: Diagnosis not present

## 2020-02-07 DIAGNOSIS — J3089 Other allergic rhinitis: Secondary | ICD-10-CM | POA: Diagnosis not present

## 2020-02-14 DIAGNOSIS — J301 Allergic rhinitis due to pollen: Secondary | ICD-10-CM | POA: Diagnosis not present

## 2020-02-14 DIAGNOSIS — J3089 Other allergic rhinitis: Secondary | ICD-10-CM | POA: Diagnosis not present

## 2020-02-16 DIAGNOSIS — J3089 Other allergic rhinitis: Secondary | ICD-10-CM | POA: Diagnosis not present

## 2020-02-16 DIAGNOSIS — J301 Allergic rhinitis due to pollen: Secondary | ICD-10-CM | POA: Diagnosis not present

## 2020-06-26 DIAGNOSIS — J301 Allergic rhinitis due to pollen: Secondary | ICD-10-CM | POA: Diagnosis not present

## 2020-06-26 DIAGNOSIS — J3089 Other allergic rhinitis: Secondary | ICD-10-CM | POA: Diagnosis not present

## 2020-07-03 DIAGNOSIS — J3089 Other allergic rhinitis: Secondary | ICD-10-CM | POA: Diagnosis not present

## 2020-07-03 DIAGNOSIS — J301 Allergic rhinitis due to pollen: Secondary | ICD-10-CM | POA: Diagnosis not present

## 2020-07-10 DIAGNOSIS — J301 Allergic rhinitis due to pollen: Secondary | ICD-10-CM | POA: Diagnosis not present

## 2020-07-10 DIAGNOSIS — J3089 Other allergic rhinitis: Secondary | ICD-10-CM | POA: Diagnosis not present

## 2020-07-17 DIAGNOSIS — J301 Allergic rhinitis due to pollen: Secondary | ICD-10-CM | POA: Diagnosis not present

## 2020-07-17 DIAGNOSIS — J3089 Other allergic rhinitis: Secondary | ICD-10-CM | POA: Diagnosis not present

## 2020-07-24 DIAGNOSIS — J3089 Other allergic rhinitis: Secondary | ICD-10-CM | POA: Diagnosis not present

## 2020-07-24 DIAGNOSIS — J301 Allergic rhinitis due to pollen: Secondary | ICD-10-CM | POA: Diagnosis not present

## 2020-07-26 DIAGNOSIS — J3089 Other allergic rhinitis: Secondary | ICD-10-CM | POA: Diagnosis not present

## 2020-07-26 DIAGNOSIS — J301 Allergic rhinitis due to pollen: Secondary | ICD-10-CM | POA: Diagnosis not present

## 2020-07-31 DIAGNOSIS — J301 Allergic rhinitis due to pollen: Secondary | ICD-10-CM | POA: Diagnosis not present

## 2020-07-31 DIAGNOSIS — J3089 Other allergic rhinitis: Secondary | ICD-10-CM | POA: Diagnosis not present

## 2020-08-06 DIAGNOSIS — Z1231 Encounter for screening mammogram for malignant neoplasm of breast: Secondary | ICD-10-CM | POA: Diagnosis not present

## 2020-08-07 DIAGNOSIS — J3089 Other allergic rhinitis: Secondary | ICD-10-CM | POA: Diagnosis not present

## 2020-08-07 DIAGNOSIS — J301 Allergic rhinitis due to pollen: Secondary | ICD-10-CM | POA: Diagnosis not present

## 2020-08-10 DIAGNOSIS — J301 Allergic rhinitis due to pollen: Secondary | ICD-10-CM | POA: Diagnosis not present

## 2020-08-10 DIAGNOSIS — J3089 Other allergic rhinitis: Secondary | ICD-10-CM | POA: Diagnosis not present

## 2020-08-13 DIAGNOSIS — J3089 Other allergic rhinitis: Secondary | ICD-10-CM | POA: Diagnosis not present

## 2020-08-13 DIAGNOSIS — J301 Allergic rhinitis due to pollen: Secondary | ICD-10-CM | POA: Diagnosis not present

## 2020-08-15 DIAGNOSIS — J301 Allergic rhinitis due to pollen: Secondary | ICD-10-CM | POA: Diagnosis not present

## 2020-08-15 DIAGNOSIS — J3089 Other allergic rhinitis: Secondary | ICD-10-CM | POA: Diagnosis not present

## 2020-08-20 DIAGNOSIS — M810 Age-related osteoporosis without current pathological fracture: Secondary | ICD-10-CM | POA: Diagnosis not present

## 2020-08-20 DIAGNOSIS — E785 Hyperlipidemia, unspecified: Secondary | ICD-10-CM | POA: Diagnosis not present

## 2020-08-20 DIAGNOSIS — E1169 Type 2 diabetes mellitus with other specified complication: Secondary | ICD-10-CM | POA: Diagnosis not present

## 2020-08-21 DIAGNOSIS — J3089 Other allergic rhinitis: Secondary | ICD-10-CM | POA: Diagnosis not present

## 2020-08-21 DIAGNOSIS — J301 Allergic rhinitis due to pollen: Secondary | ICD-10-CM | POA: Diagnosis not present

## 2020-08-27 DIAGNOSIS — M25561 Pain in right knee: Secondary | ICD-10-CM | POA: Diagnosis not present

## 2020-08-27 DIAGNOSIS — J302 Other seasonal allergic rhinitis: Secondary | ICD-10-CM | POA: Diagnosis not present

## 2020-08-27 DIAGNOSIS — M545 Low back pain, unspecified: Secondary | ICD-10-CM | POA: Diagnosis not present

## 2020-08-27 DIAGNOSIS — Z0000 Encounter for general adult medical examination without abnormal findings: Secondary | ICD-10-CM | POA: Diagnosis not present

## 2020-08-27 DIAGNOSIS — E785 Hyperlipidemia, unspecified: Secondary | ICD-10-CM | POA: Diagnosis not present

## 2020-08-27 DIAGNOSIS — E663 Overweight: Secondary | ICD-10-CM | POA: Diagnosis not present

## 2020-08-27 DIAGNOSIS — M810 Age-related osteoporosis without current pathological fracture: Secondary | ICD-10-CM | POA: Diagnosis not present

## 2020-08-27 DIAGNOSIS — Z1212 Encounter for screening for malignant neoplasm of rectum: Secondary | ICD-10-CM | POA: Diagnosis not present

## 2020-08-27 DIAGNOSIS — R82998 Other abnormal findings in urine: Secondary | ICD-10-CM | POA: Diagnosis not present

## 2020-08-27 DIAGNOSIS — I10 Essential (primary) hypertension: Secondary | ICD-10-CM | POA: Diagnosis not present

## 2020-08-28 DIAGNOSIS — J3089 Other allergic rhinitis: Secondary | ICD-10-CM | POA: Diagnosis not present

## 2020-08-28 DIAGNOSIS — J301 Allergic rhinitis due to pollen: Secondary | ICD-10-CM | POA: Diagnosis not present

## 2020-09-04 DIAGNOSIS — J3089 Other allergic rhinitis: Secondary | ICD-10-CM | POA: Diagnosis not present

## 2020-09-04 DIAGNOSIS — J301 Allergic rhinitis due to pollen: Secondary | ICD-10-CM | POA: Diagnosis not present

## 2020-09-11 DIAGNOSIS — J301 Allergic rhinitis due to pollen: Secondary | ICD-10-CM | POA: Diagnosis not present

## 2020-09-11 DIAGNOSIS — J3089 Other allergic rhinitis: Secondary | ICD-10-CM | POA: Diagnosis not present

## 2020-09-13 DIAGNOSIS — H5203 Hypermetropia, bilateral: Secondary | ICD-10-CM | POA: Diagnosis not present

## 2020-09-13 DIAGNOSIS — H25813 Combined forms of age-related cataract, bilateral: Secondary | ICD-10-CM | POA: Diagnosis not present

## 2020-09-13 DIAGNOSIS — H40033 Anatomical narrow angle, bilateral: Secondary | ICD-10-CM | POA: Diagnosis not present

## 2020-09-13 DIAGNOSIS — H5010 Unspecified exotropia: Secondary | ICD-10-CM | POA: Diagnosis not present

## 2020-09-18 DIAGNOSIS — J301 Allergic rhinitis due to pollen: Secondary | ICD-10-CM | POA: Diagnosis not present

## 2020-09-18 DIAGNOSIS — J3089 Other allergic rhinitis: Secondary | ICD-10-CM | POA: Diagnosis not present

## 2020-09-25 DIAGNOSIS — J3089 Other allergic rhinitis: Secondary | ICD-10-CM | POA: Diagnosis not present

## 2020-09-25 DIAGNOSIS — J301 Allergic rhinitis due to pollen: Secondary | ICD-10-CM | POA: Diagnosis not present

## 2020-10-02 DIAGNOSIS — J301 Allergic rhinitis due to pollen: Secondary | ICD-10-CM | POA: Diagnosis not present

## 2020-10-02 DIAGNOSIS — J3089 Other allergic rhinitis: Secondary | ICD-10-CM | POA: Diagnosis not present

## 2020-10-11 DIAGNOSIS — J301 Allergic rhinitis due to pollen: Secondary | ICD-10-CM | POA: Diagnosis not present

## 2020-10-11 DIAGNOSIS — J3089 Other allergic rhinitis: Secondary | ICD-10-CM | POA: Diagnosis not present

## 2020-10-11 DIAGNOSIS — J3081 Allergic rhinitis due to animal (cat) (dog) hair and dander: Secondary | ICD-10-CM | POA: Diagnosis not present

## 2020-10-16 DIAGNOSIS — J301 Allergic rhinitis due to pollen: Secondary | ICD-10-CM | POA: Diagnosis not present

## 2020-10-16 DIAGNOSIS — J3089 Other allergic rhinitis: Secondary | ICD-10-CM | POA: Diagnosis not present

## 2020-10-22 DIAGNOSIS — Z124 Encounter for screening for malignant neoplasm of cervix: Secondary | ICD-10-CM | POA: Diagnosis not present

## 2020-10-22 DIAGNOSIS — Z01419 Encounter for gynecological examination (general) (routine) without abnormal findings: Secondary | ICD-10-CM | POA: Diagnosis not present

## 2020-10-22 DIAGNOSIS — Z01411 Encounter for gynecological examination (general) (routine) with abnormal findings: Secondary | ICD-10-CM | POA: Diagnosis not present

## 2020-10-22 DIAGNOSIS — M8580 Other specified disorders of bone density and structure, unspecified site: Secondary | ICD-10-CM | POA: Diagnosis not present

## 2020-10-22 DIAGNOSIS — Z6829 Body mass index (BMI) 29.0-29.9, adult: Secondary | ICD-10-CM | POA: Diagnosis not present

## 2020-10-22 DIAGNOSIS — E1165 Type 2 diabetes mellitus with hyperglycemia: Secondary | ICD-10-CM | POA: Diagnosis not present

## 2020-10-23 DIAGNOSIS — J301 Allergic rhinitis due to pollen: Secondary | ICD-10-CM | POA: Diagnosis not present

## 2020-10-23 DIAGNOSIS — J3089 Other allergic rhinitis: Secondary | ICD-10-CM | POA: Diagnosis not present

## 2020-10-30 DIAGNOSIS — J301 Allergic rhinitis due to pollen: Secondary | ICD-10-CM | POA: Diagnosis not present

## 2020-10-30 DIAGNOSIS — J3089 Other allergic rhinitis: Secondary | ICD-10-CM | POA: Diagnosis not present

## 2020-11-07 DIAGNOSIS — J3089 Other allergic rhinitis: Secondary | ICD-10-CM | POA: Diagnosis not present

## 2020-11-07 DIAGNOSIS — J301 Allergic rhinitis due to pollen: Secondary | ICD-10-CM | POA: Diagnosis not present

## 2020-11-13 DIAGNOSIS — J3089 Other allergic rhinitis: Secondary | ICD-10-CM | POA: Diagnosis not present

## 2020-11-13 DIAGNOSIS — J301 Allergic rhinitis due to pollen: Secondary | ICD-10-CM | POA: Diagnosis not present

## 2020-11-20 DIAGNOSIS — J301 Allergic rhinitis due to pollen: Secondary | ICD-10-CM | POA: Diagnosis not present

## 2020-11-20 DIAGNOSIS — J3089 Other allergic rhinitis: Secondary | ICD-10-CM | POA: Diagnosis not present

## 2020-11-27 DIAGNOSIS — J301 Allergic rhinitis due to pollen: Secondary | ICD-10-CM | POA: Diagnosis not present

## 2020-11-27 DIAGNOSIS — J3089 Other allergic rhinitis: Secondary | ICD-10-CM | POA: Diagnosis not present

## 2020-12-04 DIAGNOSIS — J301 Allergic rhinitis due to pollen: Secondary | ICD-10-CM | POA: Diagnosis not present

## 2020-12-04 DIAGNOSIS — J3089 Other allergic rhinitis: Secondary | ICD-10-CM | POA: Diagnosis not present

## 2020-12-11 DIAGNOSIS — J301 Allergic rhinitis due to pollen: Secondary | ICD-10-CM | POA: Diagnosis not present

## 2020-12-11 DIAGNOSIS — J3089 Other allergic rhinitis: Secondary | ICD-10-CM | POA: Diagnosis not present

## 2020-12-18 DIAGNOSIS — J3089 Other allergic rhinitis: Secondary | ICD-10-CM | POA: Diagnosis not present

## 2020-12-18 DIAGNOSIS — J301 Allergic rhinitis due to pollen: Secondary | ICD-10-CM | POA: Diagnosis not present

## 2020-12-25 DIAGNOSIS — J3089 Other allergic rhinitis: Secondary | ICD-10-CM | POA: Diagnosis not present

## 2020-12-25 DIAGNOSIS — J301 Allergic rhinitis due to pollen: Secondary | ICD-10-CM | POA: Diagnosis not present

## 2020-12-26 DIAGNOSIS — I10 Essential (primary) hypertension: Secondary | ICD-10-CM | POA: Diagnosis not present

## 2020-12-26 DIAGNOSIS — E785 Hyperlipidemia, unspecified: Secondary | ICD-10-CM | POA: Diagnosis not present

## 2020-12-26 DIAGNOSIS — M5430 Sciatica, unspecified side: Secondary | ICD-10-CM | POA: Diagnosis not present

## 2020-12-26 DIAGNOSIS — M810 Age-related osteoporosis without current pathological fracture: Secondary | ICD-10-CM | POA: Diagnosis not present

## 2020-12-26 DIAGNOSIS — E1169 Type 2 diabetes mellitus with other specified complication: Secondary | ICD-10-CM | POA: Diagnosis not present

## 2020-12-29 DIAGNOSIS — J3089 Other allergic rhinitis: Secondary | ICD-10-CM | POA: Diagnosis not present

## 2020-12-29 DIAGNOSIS — J301 Allergic rhinitis due to pollen: Secondary | ICD-10-CM | POA: Diagnosis not present

## 2021-01-01 DIAGNOSIS — J301 Allergic rhinitis due to pollen: Secondary | ICD-10-CM | POA: Diagnosis not present

## 2021-01-01 DIAGNOSIS — J3089 Other allergic rhinitis: Secondary | ICD-10-CM | POA: Diagnosis not present

## 2021-01-02 DIAGNOSIS — M25652 Stiffness of left hip, not elsewhere classified: Secondary | ICD-10-CM | POA: Diagnosis not present

## 2021-01-02 DIAGNOSIS — R531 Weakness: Secondary | ICD-10-CM | POA: Diagnosis not present

## 2021-01-02 DIAGNOSIS — M545 Low back pain, unspecified: Secondary | ICD-10-CM | POA: Diagnosis not present

## 2021-01-02 DIAGNOSIS — R262 Difficulty in walking, not elsewhere classified: Secondary | ICD-10-CM | POA: Diagnosis not present

## 2021-01-02 DIAGNOSIS — M25552 Pain in left hip: Secondary | ICD-10-CM | POA: Diagnosis not present

## 2021-01-03 DIAGNOSIS — J3089 Other allergic rhinitis: Secondary | ICD-10-CM | POA: Diagnosis not present

## 2021-01-03 DIAGNOSIS — J301 Allergic rhinitis due to pollen: Secondary | ICD-10-CM | POA: Diagnosis not present

## 2021-01-07 DIAGNOSIS — M25652 Stiffness of left hip, not elsewhere classified: Secondary | ICD-10-CM | POA: Diagnosis not present

## 2021-01-07 DIAGNOSIS — R262 Difficulty in walking, not elsewhere classified: Secondary | ICD-10-CM | POA: Diagnosis not present

## 2021-01-07 DIAGNOSIS — R531 Weakness: Secondary | ICD-10-CM | POA: Diagnosis not present

## 2021-01-07 DIAGNOSIS — M545 Low back pain, unspecified: Secondary | ICD-10-CM | POA: Diagnosis not present

## 2021-01-07 DIAGNOSIS — M25552 Pain in left hip: Secondary | ICD-10-CM | POA: Diagnosis not present

## 2021-01-08 DIAGNOSIS — J3089 Other allergic rhinitis: Secondary | ICD-10-CM | POA: Diagnosis not present

## 2021-01-08 DIAGNOSIS — J301 Allergic rhinitis due to pollen: Secondary | ICD-10-CM | POA: Diagnosis not present

## 2021-01-10 DIAGNOSIS — M25652 Stiffness of left hip, not elsewhere classified: Secondary | ICD-10-CM | POA: Diagnosis not present

## 2021-01-10 DIAGNOSIS — R531 Weakness: Secondary | ICD-10-CM | POA: Diagnosis not present

## 2021-01-10 DIAGNOSIS — R262 Difficulty in walking, not elsewhere classified: Secondary | ICD-10-CM | POA: Diagnosis not present

## 2021-01-10 DIAGNOSIS — M25552 Pain in left hip: Secondary | ICD-10-CM | POA: Diagnosis not present

## 2021-01-10 DIAGNOSIS — M545 Low back pain, unspecified: Secondary | ICD-10-CM | POA: Diagnosis not present

## 2021-01-11 DIAGNOSIS — J3089 Other allergic rhinitis: Secondary | ICD-10-CM | POA: Diagnosis not present

## 2021-01-11 DIAGNOSIS — J301 Allergic rhinitis due to pollen: Secondary | ICD-10-CM | POA: Diagnosis not present

## 2021-01-14 DIAGNOSIS — M25652 Stiffness of left hip, not elsewhere classified: Secondary | ICD-10-CM | POA: Diagnosis not present

## 2021-01-14 DIAGNOSIS — M545 Low back pain, unspecified: Secondary | ICD-10-CM | POA: Diagnosis not present

## 2021-01-14 DIAGNOSIS — R531 Weakness: Secondary | ICD-10-CM | POA: Diagnosis not present

## 2021-01-14 DIAGNOSIS — R262 Difficulty in walking, not elsewhere classified: Secondary | ICD-10-CM | POA: Diagnosis not present

## 2021-01-14 DIAGNOSIS — M25552 Pain in left hip: Secondary | ICD-10-CM | POA: Diagnosis not present

## 2021-01-15 DIAGNOSIS — J3089 Other allergic rhinitis: Secondary | ICD-10-CM | POA: Diagnosis not present

## 2021-01-15 DIAGNOSIS — J301 Allergic rhinitis due to pollen: Secondary | ICD-10-CM | POA: Diagnosis not present

## 2021-01-17 DIAGNOSIS — R262 Difficulty in walking, not elsewhere classified: Secondary | ICD-10-CM | POA: Diagnosis not present

## 2021-01-17 DIAGNOSIS — M545 Low back pain, unspecified: Secondary | ICD-10-CM | POA: Diagnosis not present

## 2021-01-17 DIAGNOSIS — R531 Weakness: Secondary | ICD-10-CM | POA: Diagnosis not present

## 2021-01-17 DIAGNOSIS — M25552 Pain in left hip: Secondary | ICD-10-CM | POA: Diagnosis not present

## 2021-01-17 DIAGNOSIS — M25652 Stiffness of left hip, not elsewhere classified: Secondary | ICD-10-CM | POA: Diagnosis not present

## 2021-01-22 DIAGNOSIS — J3089 Other allergic rhinitis: Secondary | ICD-10-CM | POA: Diagnosis not present

## 2021-01-22 DIAGNOSIS — J301 Allergic rhinitis due to pollen: Secondary | ICD-10-CM | POA: Diagnosis not present

## 2021-01-23 DIAGNOSIS — R262 Difficulty in walking, not elsewhere classified: Secondary | ICD-10-CM | POA: Diagnosis not present

## 2021-01-23 DIAGNOSIS — R531 Weakness: Secondary | ICD-10-CM | POA: Diagnosis not present

## 2021-01-23 DIAGNOSIS — M545 Low back pain, unspecified: Secondary | ICD-10-CM | POA: Diagnosis not present

## 2021-01-23 DIAGNOSIS — M25552 Pain in left hip: Secondary | ICD-10-CM | POA: Diagnosis not present

## 2021-01-23 DIAGNOSIS — M25652 Stiffness of left hip, not elsewhere classified: Secondary | ICD-10-CM | POA: Diagnosis not present

## 2021-01-25 DIAGNOSIS — R262 Difficulty in walking, not elsewhere classified: Secondary | ICD-10-CM | POA: Diagnosis not present

## 2021-01-25 DIAGNOSIS — M25652 Stiffness of left hip, not elsewhere classified: Secondary | ICD-10-CM | POA: Diagnosis not present

## 2021-01-25 DIAGNOSIS — M545 Low back pain, unspecified: Secondary | ICD-10-CM | POA: Diagnosis not present

## 2021-01-25 DIAGNOSIS — R531 Weakness: Secondary | ICD-10-CM | POA: Diagnosis not present

## 2021-01-25 DIAGNOSIS — M25552 Pain in left hip: Secondary | ICD-10-CM | POA: Diagnosis not present

## 2021-01-28 DIAGNOSIS — M25652 Stiffness of left hip, not elsewhere classified: Secondary | ICD-10-CM | POA: Diagnosis not present

## 2021-01-28 DIAGNOSIS — M545 Low back pain, unspecified: Secondary | ICD-10-CM | POA: Diagnosis not present

## 2021-01-28 DIAGNOSIS — R531 Weakness: Secondary | ICD-10-CM | POA: Diagnosis not present

## 2021-01-28 DIAGNOSIS — M25552 Pain in left hip: Secondary | ICD-10-CM | POA: Diagnosis not present

## 2021-01-28 DIAGNOSIS — R262 Difficulty in walking, not elsewhere classified: Secondary | ICD-10-CM | POA: Diagnosis not present

## 2021-01-29 DIAGNOSIS — J3089 Other allergic rhinitis: Secondary | ICD-10-CM | POA: Diagnosis not present

## 2021-01-29 DIAGNOSIS — J301 Allergic rhinitis due to pollen: Secondary | ICD-10-CM | POA: Diagnosis not present

## 2021-01-31 DIAGNOSIS — R262 Difficulty in walking, not elsewhere classified: Secondary | ICD-10-CM | POA: Diagnosis not present

## 2021-01-31 DIAGNOSIS — M545 Low back pain, unspecified: Secondary | ICD-10-CM | POA: Diagnosis not present

## 2021-01-31 DIAGNOSIS — M25552 Pain in left hip: Secondary | ICD-10-CM | POA: Diagnosis not present

## 2021-01-31 DIAGNOSIS — M25652 Stiffness of left hip, not elsewhere classified: Secondary | ICD-10-CM | POA: Diagnosis not present

## 2021-01-31 DIAGNOSIS — R531 Weakness: Secondary | ICD-10-CM | POA: Diagnosis not present

## 2021-02-04 DIAGNOSIS — R262 Difficulty in walking, not elsewhere classified: Secondary | ICD-10-CM | POA: Diagnosis not present

## 2021-02-04 DIAGNOSIS — M545 Low back pain, unspecified: Secondary | ICD-10-CM | POA: Diagnosis not present

## 2021-02-04 DIAGNOSIS — R531 Weakness: Secondary | ICD-10-CM | POA: Diagnosis not present

## 2021-02-04 DIAGNOSIS — M25652 Stiffness of left hip, not elsewhere classified: Secondary | ICD-10-CM | POA: Diagnosis not present

## 2021-02-04 DIAGNOSIS — M25552 Pain in left hip: Secondary | ICD-10-CM | POA: Diagnosis not present

## 2021-02-05 DIAGNOSIS — J301 Allergic rhinitis due to pollen: Secondary | ICD-10-CM | POA: Diagnosis not present

## 2021-02-05 DIAGNOSIS — J3089 Other allergic rhinitis: Secondary | ICD-10-CM | POA: Diagnosis not present

## 2021-02-07 DIAGNOSIS — M545 Low back pain, unspecified: Secondary | ICD-10-CM | POA: Diagnosis not present

## 2021-02-07 DIAGNOSIS — M25552 Pain in left hip: Secondary | ICD-10-CM | POA: Diagnosis not present

## 2021-02-07 DIAGNOSIS — R531 Weakness: Secondary | ICD-10-CM | POA: Diagnosis not present

## 2021-02-07 DIAGNOSIS — R262 Difficulty in walking, not elsewhere classified: Secondary | ICD-10-CM | POA: Diagnosis not present

## 2021-02-07 DIAGNOSIS — M25652 Stiffness of left hip, not elsewhere classified: Secondary | ICD-10-CM | POA: Diagnosis not present

## 2021-02-08 DIAGNOSIS — M545 Low back pain, unspecified: Secondary | ICD-10-CM | POA: Diagnosis not present

## 2021-02-12 DIAGNOSIS — J301 Allergic rhinitis due to pollen: Secondary | ICD-10-CM | POA: Diagnosis not present

## 2021-02-12 DIAGNOSIS — J3089 Other allergic rhinitis: Secondary | ICD-10-CM | POA: Diagnosis not present

## 2021-02-19 DIAGNOSIS — J301 Allergic rhinitis due to pollen: Secondary | ICD-10-CM | POA: Diagnosis not present

## 2021-02-19 DIAGNOSIS — J3089 Other allergic rhinitis: Secondary | ICD-10-CM | POA: Diagnosis not present

## 2021-02-26 DIAGNOSIS — J3089 Other allergic rhinitis: Secondary | ICD-10-CM | POA: Diagnosis not present

## 2021-02-26 DIAGNOSIS — J301 Allergic rhinitis due to pollen: Secondary | ICD-10-CM | POA: Diagnosis not present

## 2021-03-05 DIAGNOSIS — J3089 Other allergic rhinitis: Secondary | ICD-10-CM | POA: Diagnosis not present

## 2021-03-05 DIAGNOSIS — J301 Allergic rhinitis due to pollen: Secondary | ICD-10-CM | POA: Diagnosis not present

## 2021-03-12 DIAGNOSIS — J3089 Other allergic rhinitis: Secondary | ICD-10-CM | POA: Diagnosis not present

## 2021-03-12 DIAGNOSIS — J301 Allergic rhinitis due to pollen: Secondary | ICD-10-CM | POA: Diagnosis not present

## 2021-03-19 DIAGNOSIS — J3089 Other allergic rhinitis: Secondary | ICD-10-CM | POA: Diagnosis not present

## 2021-03-19 DIAGNOSIS — J301 Allergic rhinitis due to pollen: Secondary | ICD-10-CM | POA: Diagnosis not present

## 2021-03-26 DIAGNOSIS — J3089 Other allergic rhinitis: Secondary | ICD-10-CM | POA: Diagnosis not present

## 2021-03-26 DIAGNOSIS — J301 Allergic rhinitis due to pollen: Secondary | ICD-10-CM | POA: Diagnosis not present

## 2021-04-02 DIAGNOSIS — J301 Allergic rhinitis due to pollen: Secondary | ICD-10-CM | POA: Diagnosis not present

## 2021-04-02 DIAGNOSIS — J3089 Other allergic rhinitis: Secondary | ICD-10-CM | POA: Diagnosis not present

## 2021-04-04 DIAGNOSIS — M79605 Pain in left leg: Secondary | ICD-10-CM | POA: Diagnosis not present

## 2021-04-04 DIAGNOSIS — M2569 Stiffness of other specified joint, not elsewhere classified: Secondary | ICD-10-CM | POA: Diagnosis not present

## 2021-04-04 DIAGNOSIS — M545 Low back pain, unspecified: Secondary | ICD-10-CM | POA: Diagnosis not present

## 2021-04-04 DIAGNOSIS — R531 Weakness: Secondary | ICD-10-CM | POA: Diagnosis not present

## 2021-04-08 DIAGNOSIS — M2569 Stiffness of other specified joint, not elsewhere classified: Secondary | ICD-10-CM | POA: Diagnosis not present

## 2021-04-08 DIAGNOSIS — R531 Weakness: Secondary | ICD-10-CM | POA: Diagnosis not present

## 2021-04-08 DIAGNOSIS — M79605 Pain in left leg: Secondary | ICD-10-CM | POA: Diagnosis not present

## 2021-04-08 DIAGNOSIS — M545 Low back pain, unspecified: Secondary | ICD-10-CM | POA: Diagnosis not present

## 2021-04-09 DIAGNOSIS — J3089 Other allergic rhinitis: Secondary | ICD-10-CM | POA: Diagnosis not present

## 2021-04-09 DIAGNOSIS — J301 Allergic rhinitis due to pollen: Secondary | ICD-10-CM | POA: Diagnosis not present

## 2021-04-11 DIAGNOSIS — M79605 Pain in left leg: Secondary | ICD-10-CM | POA: Diagnosis not present

## 2021-04-11 DIAGNOSIS — M545 Low back pain, unspecified: Secondary | ICD-10-CM | POA: Diagnosis not present

## 2021-04-11 DIAGNOSIS — R531 Weakness: Secondary | ICD-10-CM | POA: Diagnosis not present

## 2021-04-11 DIAGNOSIS — M2569 Stiffness of other specified joint, not elsewhere classified: Secondary | ICD-10-CM | POA: Diagnosis not present

## 2021-04-15 DIAGNOSIS — M79605 Pain in left leg: Secondary | ICD-10-CM | POA: Diagnosis not present

## 2021-04-15 DIAGNOSIS — M545 Low back pain, unspecified: Secondary | ICD-10-CM | POA: Diagnosis not present

## 2021-04-15 DIAGNOSIS — M2569 Stiffness of other specified joint, not elsewhere classified: Secondary | ICD-10-CM | POA: Diagnosis not present

## 2021-04-15 DIAGNOSIS — R531 Weakness: Secondary | ICD-10-CM | POA: Diagnosis not present

## 2021-04-16 DIAGNOSIS — J3089 Other allergic rhinitis: Secondary | ICD-10-CM | POA: Diagnosis not present

## 2021-04-16 DIAGNOSIS — J301 Allergic rhinitis due to pollen: Secondary | ICD-10-CM | POA: Diagnosis not present

## 2021-04-17 DIAGNOSIS — M545 Low back pain, unspecified: Secondary | ICD-10-CM | POA: Diagnosis not present

## 2021-04-17 DIAGNOSIS — M79605 Pain in left leg: Secondary | ICD-10-CM | POA: Diagnosis not present

## 2021-04-17 DIAGNOSIS — M2569 Stiffness of other specified joint, not elsewhere classified: Secondary | ICD-10-CM | POA: Diagnosis not present

## 2021-04-17 DIAGNOSIS — R531 Weakness: Secondary | ICD-10-CM | POA: Diagnosis not present

## 2021-04-23 DIAGNOSIS — R531 Weakness: Secondary | ICD-10-CM | POA: Diagnosis not present

## 2021-04-23 DIAGNOSIS — M2569 Stiffness of other specified joint, not elsewhere classified: Secondary | ICD-10-CM | POA: Diagnosis not present

## 2021-04-23 DIAGNOSIS — J3089 Other allergic rhinitis: Secondary | ICD-10-CM | POA: Diagnosis not present

## 2021-04-23 DIAGNOSIS — J301 Allergic rhinitis due to pollen: Secondary | ICD-10-CM | POA: Diagnosis not present

## 2021-04-23 DIAGNOSIS — M545 Low back pain, unspecified: Secondary | ICD-10-CM | POA: Diagnosis not present

## 2021-04-23 DIAGNOSIS — M79605 Pain in left leg: Secondary | ICD-10-CM | POA: Diagnosis not present

## 2021-04-25 DIAGNOSIS — R531 Weakness: Secondary | ICD-10-CM | POA: Diagnosis not present

## 2021-04-25 DIAGNOSIS — M2569 Stiffness of other specified joint, not elsewhere classified: Secondary | ICD-10-CM | POA: Diagnosis not present

## 2021-04-25 DIAGNOSIS — M79605 Pain in left leg: Secondary | ICD-10-CM | POA: Diagnosis not present

## 2021-04-25 DIAGNOSIS — M545 Low back pain, unspecified: Secondary | ICD-10-CM | POA: Diagnosis not present

## 2021-04-29 DIAGNOSIS — M1990 Unspecified osteoarthritis, unspecified site: Secondary | ICD-10-CM | POA: Diagnosis not present

## 2021-04-29 DIAGNOSIS — E785 Hyperlipidemia, unspecified: Secondary | ICD-10-CM | POA: Diagnosis not present

## 2021-04-29 DIAGNOSIS — I10 Essential (primary) hypertension: Secondary | ICD-10-CM | POA: Diagnosis not present

## 2021-04-29 DIAGNOSIS — M5430 Sciatica, unspecified side: Secondary | ICD-10-CM | POA: Diagnosis not present

## 2021-04-29 DIAGNOSIS — J302 Other seasonal allergic rhinitis: Secondary | ICD-10-CM | POA: Diagnosis not present

## 2021-04-29 DIAGNOSIS — E1169 Type 2 diabetes mellitus with other specified complication: Secondary | ICD-10-CM | POA: Diagnosis not present

## 2021-04-30 DIAGNOSIS — M79605 Pain in left leg: Secondary | ICD-10-CM | POA: Diagnosis not present

## 2021-04-30 DIAGNOSIS — J301 Allergic rhinitis due to pollen: Secondary | ICD-10-CM | POA: Diagnosis not present

## 2021-04-30 DIAGNOSIS — J3089 Other allergic rhinitis: Secondary | ICD-10-CM | POA: Diagnosis not present

## 2021-04-30 DIAGNOSIS — M2569 Stiffness of other specified joint, not elsewhere classified: Secondary | ICD-10-CM | POA: Diagnosis not present

## 2021-04-30 DIAGNOSIS — R531 Weakness: Secondary | ICD-10-CM | POA: Diagnosis not present

## 2021-04-30 DIAGNOSIS — M545 Low back pain, unspecified: Secondary | ICD-10-CM | POA: Diagnosis not present

## 2021-05-02 DIAGNOSIS — M545 Low back pain, unspecified: Secondary | ICD-10-CM | POA: Diagnosis not present

## 2021-05-02 DIAGNOSIS — M2569 Stiffness of other specified joint, not elsewhere classified: Secondary | ICD-10-CM | POA: Diagnosis not present

## 2021-05-02 DIAGNOSIS — R531 Weakness: Secondary | ICD-10-CM | POA: Diagnosis not present

## 2021-05-02 DIAGNOSIS — M79605 Pain in left leg: Secondary | ICD-10-CM | POA: Diagnosis not present

## 2021-05-06 DIAGNOSIS — M545 Low back pain, unspecified: Secondary | ICD-10-CM | POA: Diagnosis not present

## 2021-05-06 DIAGNOSIS — M2569 Stiffness of other specified joint, not elsewhere classified: Secondary | ICD-10-CM | POA: Diagnosis not present

## 2021-05-06 DIAGNOSIS — R531 Weakness: Secondary | ICD-10-CM | POA: Diagnosis not present

## 2021-05-06 DIAGNOSIS — M79605 Pain in left leg: Secondary | ICD-10-CM | POA: Diagnosis not present

## 2021-05-07 DIAGNOSIS — J301 Allergic rhinitis due to pollen: Secondary | ICD-10-CM | POA: Diagnosis not present

## 2021-05-07 DIAGNOSIS — J3089 Other allergic rhinitis: Secondary | ICD-10-CM | POA: Diagnosis not present

## 2021-05-08 DIAGNOSIS — M545 Low back pain, unspecified: Secondary | ICD-10-CM | POA: Diagnosis not present

## 2021-05-08 DIAGNOSIS — R531 Weakness: Secondary | ICD-10-CM | POA: Diagnosis not present

## 2021-05-08 DIAGNOSIS — M79605 Pain in left leg: Secondary | ICD-10-CM | POA: Diagnosis not present

## 2021-05-08 DIAGNOSIS — M2569 Stiffness of other specified joint, not elsewhere classified: Secondary | ICD-10-CM | POA: Diagnosis not present

## 2021-05-13 DIAGNOSIS — J3089 Other allergic rhinitis: Secondary | ICD-10-CM | POA: Diagnosis not present

## 2021-05-13 DIAGNOSIS — J301 Allergic rhinitis due to pollen: Secondary | ICD-10-CM | POA: Diagnosis not present

## 2021-05-14 DIAGNOSIS — M79605 Pain in left leg: Secondary | ICD-10-CM | POA: Diagnosis not present

## 2021-05-14 DIAGNOSIS — M2569 Stiffness of other specified joint, not elsewhere classified: Secondary | ICD-10-CM | POA: Diagnosis not present

## 2021-05-14 DIAGNOSIS — R531 Weakness: Secondary | ICD-10-CM | POA: Diagnosis not present

## 2021-05-14 DIAGNOSIS — M545 Low back pain, unspecified: Secondary | ICD-10-CM | POA: Diagnosis not present

## 2021-05-15 DIAGNOSIS — J3089 Other allergic rhinitis: Secondary | ICD-10-CM | POA: Diagnosis not present

## 2021-05-15 DIAGNOSIS — J301 Allergic rhinitis due to pollen: Secondary | ICD-10-CM | POA: Diagnosis not present

## 2021-05-16 DIAGNOSIS — R531 Weakness: Secondary | ICD-10-CM | POA: Diagnosis not present

## 2021-05-16 DIAGNOSIS — M79605 Pain in left leg: Secondary | ICD-10-CM | POA: Diagnosis not present

## 2021-05-16 DIAGNOSIS — M2569 Stiffness of other specified joint, not elsewhere classified: Secondary | ICD-10-CM | POA: Diagnosis not present

## 2021-05-16 DIAGNOSIS — M545 Low back pain, unspecified: Secondary | ICD-10-CM | POA: Diagnosis not present

## 2021-05-21 DIAGNOSIS — J301 Allergic rhinitis due to pollen: Secondary | ICD-10-CM | POA: Diagnosis not present

## 2021-05-21 DIAGNOSIS — M545 Low back pain, unspecified: Secondary | ICD-10-CM | POA: Diagnosis not present

## 2021-05-21 DIAGNOSIS — M79605 Pain in left leg: Secondary | ICD-10-CM | POA: Diagnosis not present

## 2021-05-21 DIAGNOSIS — R531 Weakness: Secondary | ICD-10-CM | POA: Diagnosis not present

## 2021-05-21 DIAGNOSIS — J3089 Other allergic rhinitis: Secondary | ICD-10-CM | POA: Diagnosis not present

## 2021-05-21 DIAGNOSIS — M2569 Stiffness of other specified joint, not elsewhere classified: Secondary | ICD-10-CM | POA: Diagnosis not present

## 2021-05-22 DIAGNOSIS — H524 Presbyopia: Secondary | ICD-10-CM | POA: Diagnosis not present

## 2021-05-22 DIAGNOSIS — H353122 Nonexudative age-related macular degeneration, left eye, intermediate dry stage: Secondary | ICD-10-CM | POA: Diagnosis not present

## 2021-05-22 DIAGNOSIS — H2513 Age-related nuclear cataract, bilateral: Secondary | ICD-10-CM | POA: Diagnosis not present

## 2021-05-22 DIAGNOSIS — E119 Type 2 diabetes mellitus without complications: Secondary | ICD-10-CM | POA: Diagnosis not present

## 2021-05-23 DIAGNOSIS — M2569 Stiffness of other specified joint, not elsewhere classified: Secondary | ICD-10-CM | POA: Diagnosis not present

## 2021-05-23 DIAGNOSIS — M545 Low back pain, unspecified: Secondary | ICD-10-CM | POA: Diagnosis not present

## 2021-05-23 DIAGNOSIS — M79605 Pain in left leg: Secondary | ICD-10-CM | POA: Diagnosis not present

## 2021-05-23 DIAGNOSIS — R531 Weakness: Secondary | ICD-10-CM | POA: Diagnosis not present

## 2021-05-24 DIAGNOSIS — J3089 Other allergic rhinitis: Secondary | ICD-10-CM | POA: Diagnosis not present

## 2021-05-24 DIAGNOSIS — J301 Allergic rhinitis due to pollen: Secondary | ICD-10-CM | POA: Diagnosis not present

## 2021-05-28 DIAGNOSIS — J3089 Other allergic rhinitis: Secondary | ICD-10-CM | POA: Diagnosis not present

## 2021-05-28 DIAGNOSIS — R531 Weakness: Secondary | ICD-10-CM | POA: Diagnosis not present

## 2021-05-28 DIAGNOSIS — J301 Allergic rhinitis due to pollen: Secondary | ICD-10-CM | POA: Diagnosis not present

## 2021-05-28 DIAGNOSIS — M2569 Stiffness of other specified joint, not elsewhere classified: Secondary | ICD-10-CM | POA: Diagnosis not present

## 2021-05-28 DIAGNOSIS — M79605 Pain in left leg: Secondary | ICD-10-CM | POA: Diagnosis not present

## 2021-05-28 DIAGNOSIS — M545 Low back pain, unspecified: Secondary | ICD-10-CM | POA: Diagnosis not present

## 2021-05-30 DIAGNOSIS — M2569 Stiffness of other specified joint, not elsewhere classified: Secondary | ICD-10-CM | POA: Diagnosis not present

## 2021-05-30 DIAGNOSIS — M79605 Pain in left leg: Secondary | ICD-10-CM | POA: Diagnosis not present

## 2021-05-30 DIAGNOSIS — M545 Low back pain, unspecified: Secondary | ICD-10-CM | POA: Diagnosis not present

## 2021-05-30 DIAGNOSIS — R531 Weakness: Secondary | ICD-10-CM | POA: Diagnosis not present

## 2021-05-31 DIAGNOSIS — T63441D Toxic effect of venom of bees, accidental (unintentional), subsequent encounter: Secondary | ICD-10-CM | POA: Diagnosis not present

## 2021-05-31 DIAGNOSIS — J3081 Allergic rhinitis due to animal (cat) (dog) hair and dander: Secondary | ICD-10-CM | POA: Diagnosis not present

## 2021-05-31 DIAGNOSIS — J3089 Other allergic rhinitis: Secondary | ICD-10-CM | POA: Diagnosis not present

## 2021-05-31 DIAGNOSIS — J301 Allergic rhinitis due to pollen: Secondary | ICD-10-CM | POA: Diagnosis not present

## 2021-06-04 DIAGNOSIS — M79605 Pain in left leg: Secondary | ICD-10-CM | POA: Diagnosis not present

## 2021-06-04 DIAGNOSIS — M545 Low back pain, unspecified: Secondary | ICD-10-CM | POA: Diagnosis not present

## 2021-06-04 DIAGNOSIS — J3089 Other allergic rhinitis: Secondary | ICD-10-CM | POA: Diagnosis not present

## 2021-06-04 DIAGNOSIS — J301 Allergic rhinitis due to pollen: Secondary | ICD-10-CM | POA: Diagnosis not present

## 2021-06-04 DIAGNOSIS — M2569 Stiffness of other specified joint, not elsewhere classified: Secondary | ICD-10-CM | POA: Diagnosis not present

## 2021-06-04 DIAGNOSIS — R531 Weakness: Secondary | ICD-10-CM | POA: Diagnosis not present

## 2021-06-06 DIAGNOSIS — M545 Low back pain, unspecified: Secondary | ICD-10-CM | POA: Diagnosis not present

## 2021-06-06 DIAGNOSIS — M79605 Pain in left leg: Secondary | ICD-10-CM | POA: Diagnosis not present

## 2021-06-06 DIAGNOSIS — M2569 Stiffness of other specified joint, not elsewhere classified: Secondary | ICD-10-CM | POA: Diagnosis not present

## 2021-06-06 DIAGNOSIS — R531 Weakness: Secondary | ICD-10-CM | POA: Diagnosis not present

## 2021-06-11 DIAGNOSIS — M545 Low back pain, unspecified: Secondary | ICD-10-CM | POA: Diagnosis not present

## 2021-06-11 DIAGNOSIS — J3089 Other allergic rhinitis: Secondary | ICD-10-CM | POA: Diagnosis not present

## 2021-06-11 DIAGNOSIS — M2569 Stiffness of other specified joint, not elsewhere classified: Secondary | ICD-10-CM | POA: Diagnosis not present

## 2021-06-11 DIAGNOSIS — R531 Weakness: Secondary | ICD-10-CM | POA: Diagnosis not present

## 2021-06-11 DIAGNOSIS — M79605 Pain in left leg: Secondary | ICD-10-CM | POA: Diagnosis not present

## 2021-06-11 DIAGNOSIS — J301 Allergic rhinitis due to pollen: Secondary | ICD-10-CM | POA: Diagnosis not present

## 2021-06-13 DIAGNOSIS — R531 Weakness: Secondary | ICD-10-CM | POA: Diagnosis not present

## 2021-06-13 DIAGNOSIS — E119 Type 2 diabetes mellitus without complications: Secondary | ICD-10-CM | POA: Diagnosis not present

## 2021-06-13 DIAGNOSIS — M545 Low back pain, unspecified: Secondary | ICD-10-CM | POA: Diagnosis not present

## 2021-06-13 DIAGNOSIS — B379 Candidiasis, unspecified: Secondary | ICD-10-CM | POA: Diagnosis not present

## 2021-06-13 DIAGNOSIS — G4700 Insomnia, unspecified: Secondary | ICD-10-CM | POA: Diagnosis not present

## 2021-06-13 DIAGNOSIS — I10 Essential (primary) hypertension: Secondary | ICD-10-CM | POA: Diagnosis not present

## 2021-06-13 DIAGNOSIS — M2569 Stiffness of other specified joint, not elsewhere classified: Secondary | ICD-10-CM | POA: Diagnosis not present

## 2021-06-13 DIAGNOSIS — G8929 Other chronic pain: Secondary | ICD-10-CM | POA: Diagnosis not present

## 2021-06-13 DIAGNOSIS — M79605 Pain in left leg: Secondary | ICD-10-CM | POA: Diagnosis not present

## 2021-06-13 DIAGNOSIS — E663 Overweight: Secondary | ICD-10-CM | POA: Diagnosis not present

## 2021-06-13 DIAGNOSIS — E785 Hyperlipidemia, unspecified: Secondary | ICD-10-CM | POA: Diagnosis not present

## 2021-06-13 DIAGNOSIS — M1990 Unspecified osteoarthritis, unspecified site: Secondary | ICD-10-CM | POA: Diagnosis not present

## 2021-06-13 DIAGNOSIS — J301 Allergic rhinitis due to pollen: Secondary | ICD-10-CM | POA: Diagnosis not present

## 2021-06-18 DIAGNOSIS — J301 Allergic rhinitis due to pollen: Secondary | ICD-10-CM | POA: Diagnosis not present

## 2021-06-18 DIAGNOSIS — R531 Weakness: Secondary | ICD-10-CM | POA: Diagnosis not present

## 2021-06-18 DIAGNOSIS — M79605 Pain in left leg: Secondary | ICD-10-CM | POA: Diagnosis not present

## 2021-06-18 DIAGNOSIS — J3089 Other allergic rhinitis: Secondary | ICD-10-CM | POA: Diagnosis not present

## 2021-06-18 DIAGNOSIS — M545 Low back pain, unspecified: Secondary | ICD-10-CM | POA: Diagnosis not present

## 2021-06-18 DIAGNOSIS — M2569 Stiffness of other specified joint, not elsewhere classified: Secondary | ICD-10-CM | POA: Diagnosis not present

## 2021-06-20 DIAGNOSIS — M2569 Stiffness of other specified joint, not elsewhere classified: Secondary | ICD-10-CM | POA: Diagnosis not present

## 2021-06-20 DIAGNOSIS — M545 Low back pain, unspecified: Secondary | ICD-10-CM | POA: Diagnosis not present

## 2021-06-20 DIAGNOSIS — M79605 Pain in left leg: Secondary | ICD-10-CM | POA: Diagnosis not present

## 2021-06-20 DIAGNOSIS — R531 Weakness: Secondary | ICD-10-CM | POA: Diagnosis not present

## 2021-06-25 DIAGNOSIS — J3089 Other allergic rhinitis: Secondary | ICD-10-CM | POA: Diagnosis not present

## 2021-06-25 DIAGNOSIS — J301 Allergic rhinitis due to pollen: Secondary | ICD-10-CM | POA: Diagnosis not present

## 2021-06-25 DIAGNOSIS — M545 Low back pain, unspecified: Secondary | ICD-10-CM | POA: Diagnosis not present

## 2021-06-25 DIAGNOSIS — M2569 Stiffness of other specified joint, not elsewhere classified: Secondary | ICD-10-CM | POA: Diagnosis not present

## 2021-06-25 DIAGNOSIS — M79605 Pain in left leg: Secondary | ICD-10-CM | POA: Diagnosis not present

## 2021-06-25 DIAGNOSIS — R531 Weakness: Secondary | ICD-10-CM | POA: Diagnosis not present

## 2021-07-02 DIAGNOSIS — M2569 Stiffness of other specified joint, not elsewhere classified: Secondary | ICD-10-CM | POA: Diagnosis not present

## 2021-07-02 DIAGNOSIS — J301 Allergic rhinitis due to pollen: Secondary | ICD-10-CM | POA: Diagnosis not present

## 2021-07-02 DIAGNOSIS — J3089 Other allergic rhinitis: Secondary | ICD-10-CM | POA: Diagnosis not present

## 2021-07-02 DIAGNOSIS — M79605 Pain in left leg: Secondary | ICD-10-CM | POA: Diagnosis not present

## 2021-07-02 DIAGNOSIS — R531 Weakness: Secondary | ICD-10-CM | POA: Diagnosis not present

## 2021-07-02 DIAGNOSIS — M545 Low back pain, unspecified: Secondary | ICD-10-CM | POA: Diagnosis not present

## 2021-07-05 DIAGNOSIS — M545 Low back pain, unspecified: Secondary | ICD-10-CM | POA: Diagnosis not present

## 2021-07-05 DIAGNOSIS — M2569 Stiffness of other specified joint, not elsewhere classified: Secondary | ICD-10-CM | POA: Diagnosis not present

## 2021-07-05 DIAGNOSIS — M79605 Pain in left leg: Secondary | ICD-10-CM | POA: Diagnosis not present

## 2021-07-05 DIAGNOSIS — R531 Weakness: Secondary | ICD-10-CM | POA: Diagnosis not present

## 2021-07-09 DIAGNOSIS — M2569 Stiffness of other specified joint, not elsewhere classified: Secondary | ICD-10-CM | POA: Diagnosis not present

## 2021-07-09 DIAGNOSIS — M79605 Pain in left leg: Secondary | ICD-10-CM | POA: Diagnosis not present

## 2021-07-09 DIAGNOSIS — J301 Allergic rhinitis due to pollen: Secondary | ICD-10-CM | POA: Diagnosis not present

## 2021-07-09 DIAGNOSIS — M545 Low back pain, unspecified: Secondary | ICD-10-CM | POA: Diagnosis not present

## 2021-07-09 DIAGNOSIS — J3089 Other allergic rhinitis: Secondary | ICD-10-CM | POA: Diagnosis not present

## 2021-07-09 DIAGNOSIS — R531 Weakness: Secondary | ICD-10-CM | POA: Diagnosis not present

## 2021-07-11 DIAGNOSIS — M2569 Stiffness of other specified joint, not elsewhere classified: Secondary | ICD-10-CM | POA: Diagnosis not present

## 2021-07-11 DIAGNOSIS — M545 Low back pain, unspecified: Secondary | ICD-10-CM | POA: Diagnosis not present

## 2021-07-11 DIAGNOSIS — R531 Weakness: Secondary | ICD-10-CM | POA: Diagnosis not present

## 2021-07-11 DIAGNOSIS — M79605 Pain in left leg: Secondary | ICD-10-CM | POA: Diagnosis not present

## 2021-07-16 DIAGNOSIS — R531 Weakness: Secondary | ICD-10-CM | POA: Diagnosis not present

## 2021-07-16 DIAGNOSIS — M2569 Stiffness of other specified joint, not elsewhere classified: Secondary | ICD-10-CM | POA: Diagnosis not present

## 2021-07-16 DIAGNOSIS — J3089 Other allergic rhinitis: Secondary | ICD-10-CM | POA: Diagnosis not present

## 2021-07-16 DIAGNOSIS — J301 Allergic rhinitis due to pollen: Secondary | ICD-10-CM | POA: Diagnosis not present

## 2021-07-16 DIAGNOSIS — M545 Low back pain, unspecified: Secondary | ICD-10-CM | POA: Diagnosis not present

## 2021-07-16 DIAGNOSIS — M79605 Pain in left leg: Secondary | ICD-10-CM | POA: Diagnosis not present

## 2021-07-18 DIAGNOSIS — M2569 Stiffness of other specified joint, not elsewhere classified: Secondary | ICD-10-CM | POA: Diagnosis not present

## 2021-07-18 DIAGNOSIS — M79605 Pain in left leg: Secondary | ICD-10-CM | POA: Diagnosis not present

## 2021-07-18 DIAGNOSIS — R531 Weakness: Secondary | ICD-10-CM | POA: Diagnosis not present

## 2021-07-18 DIAGNOSIS — M545 Low back pain, unspecified: Secondary | ICD-10-CM | POA: Diagnosis not present

## 2021-07-23 DIAGNOSIS — R531 Weakness: Secondary | ICD-10-CM | POA: Diagnosis not present

## 2021-07-23 DIAGNOSIS — M545 Low back pain, unspecified: Secondary | ICD-10-CM | POA: Diagnosis not present

## 2021-07-23 DIAGNOSIS — J3089 Other allergic rhinitis: Secondary | ICD-10-CM | POA: Diagnosis not present

## 2021-07-23 DIAGNOSIS — J301 Allergic rhinitis due to pollen: Secondary | ICD-10-CM | POA: Diagnosis not present

## 2021-07-23 DIAGNOSIS — M79605 Pain in left leg: Secondary | ICD-10-CM | POA: Diagnosis not present

## 2021-07-23 DIAGNOSIS — M2569 Stiffness of other specified joint, not elsewhere classified: Secondary | ICD-10-CM | POA: Diagnosis not present

## 2021-07-25 DIAGNOSIS — M2569 Stiffness of other specified joint, not elsewhere classified: Secondary | ICD-10-CM | POA: Diagnosis not present

## 2021-07-25 DIAGNOSIS — R531 Weakness: Secondary | ICD-10-CM | POA: Diagnosis not present

## 2021-07-25 DIAGNOSIS — M545 Low back pain, unspecified: Secondary | ICD-10-CM | POA: Diagnosis not present

## 2021-07-25 DIAGNOSIS — M79605 Pain in left leg: Secondary | ICD-10-CM | POA: Diagnosis not present

## 2021-07-30 DIAGNOSIS — M79605 Pain in left leg: Secondary | ICD-10-CM | POA: Diagnosis not present

## 2021-07-30 DIAGNOSIS — M2569 Stiffness of other specified joint, not elsewhere classified: Secondary | ICD-10-CM | POA: Diagnosis not present

## 2021-07-30 DIAGNOSIS — J3089 Other allergic rhinitis: Secondary | ICD-10-CM | POA: Diagnosis not present

## 2021-07-30 DIAGNOSIS — R531 Weakness: Secondary | ICD-10-CM | POA: Diagnosis not present

## 2021-07-30 DIAGNOSIS — J301 Allergic rhinitis due to pollen: Secondary | ICD-10-CM | POA: Diagnosis not present

## 2021-07-30 DIAGNOSIS — M545 Low back pain, unspecified: Secondary | ICD-10-CM | POA: Diagnosis not present

## 2021-08-06 DIAGNOSIS — J301 Allergic rhinitis due to pollen: Secondary | ICD-10-CM | POA: Diagnosis not present

## 2021-08-06 DIAGNOSIS — J3089 Other allergic rhinitis: Secondary | ICD-10-CM | POA: Diagnosis not present

## 2021-08-12 DIAGNOSIS — Z1231 Encounter for screening mammogram for malignant neoplasm of breast: Secondary | ICD-10-CM | POA: Diagnosis not present

## 2021-08-14 DIAGNOSIS — J3089 Other allergic rhinitis: Secondary | ICD-10-CM | POA: Diagnosis not present

## 2021-08-14 DIAGNOSIS — J301 Allergic rhinitis due to pollen: Secondary | ICD-10-CM | POA: Diagnosis not present

## 2021-08-20 DIAGNOSIS — J3089 Other allergic rhinitis: Secondary | ICD-10-CM | POA: Diagnosis not present

## 2021-08-20 DIAGNOSIS — J301 Allergic rhinitis due to pollen: Secondary | ICD-10-CM | POA: Diagnosis not present

## 2021-08-27 DIAGNOSIS — J3089 Other allergic rhinitis: Secondary | ICD-10-CM | POA: Diagnosis not present

## 2021-08-27 DIAGNOSIS — J301 Allergic rhinitis due to pollen: Secondary | ICD-10-CM | POA: Diagnosis not present

## 2021-08-28 DIAGNOSIS — E1169 Type 2 diabetes mellitus with other specified complication: Secondary | ICD-10-CM | POA: Diagnosis not present

## 2021-08-28 DIAGNOSIS — E785 Hyperlipidemia, unspecified: Secondary | ICD-10-CM | POA: Diagnosis not present

## 2021-08-28 DIAGNOSIS — M810 Age-related osteoporosis without current pathological fracture: Secondary | ICD-10-CM | POA: Diagnosis not present

## 2021-08-28 DIAGNOSIS — I10 Essential (primary) hypertension: Secondary | ICD-10-CM | POA: Diagnosis not present

## 2021-09-03 DIAGNOSIS — J301 Allergic rhinitis due to pollen: Secondary | ICD-10-CM | POA: Diagnosis not present

## 2021-09-03 DIAGNOSIS — J3089 Other allergic rhinitis: Secondary | ICD-10-CM | POA: Diagnosis not present

## 2021-09-04 DIAGNOSIS — R82998 Other abnormal findings in urine: Secondary | ICD-10-CM | POA: Diagnosis not present

## 2021-09-04 DIAGNOSIS — M5430 Sciatica, unspecified side: Secondary | ICD-10-CM | POA: Diagnosis not present

## 2021-09-04 DIAGNOSIS — Z0000 Encounter for general adult medical examination without abnormal findings: Secondary | ICD-10-CM | POA: Diagnosis not present

## 2021-09-04 DIAGNOSIS — I10 Essential (primary) hypertension: Secondary | ICD-10-CM | POA: Diagnosis not present

## 2021-09-04 DIAGNOSIS — Z1339 Encounter for screening examination for other mental health and behavioral disorders: Secondary | ICD-10-CM | POA: Diagnosis not present

## 2021-09-04 DIAGNOSIS — E785 Hyperlipidemia, unspecified: Secondary | ICD-10-CM | POA: Diagnosis not present

## 2021-09-04 DIAGNOSIS — Z20818 Contact with and (suspected) exposure to other bacterial communicable diseases: Secondary | ICD-10-CM | POA: Diagnosis not present

## 2021-09-04 DIAGNOSIS — E119 Type 2 diabetes mellitus without complications: Secondary | ICD-10-CM | POA: Diagnosis not present

## 2021-09-04 DIAGNOSIS — Z1331 Encounter for screening for depression: Secondary | ICD-10-CM | POA: Diagnosis not present

## 2021-09-04 DIAGNOSIS — R509 Fever, unspecified: Secondary | ICD-10-CM | POA: Diagnosis not present

## 2021-09-04 DIAGNOSIS — Z1152 Encounter for screening for COVID-19: Secondary | ICD-10-CM | POA: Diagnosis not present

## 2021-09-10 DIAGNOSIS — J301 Allergic rhinitis due to pollen: Secondary | ICD-10-CM | POA: Diagnosis not present

## 2021-09-10 DIAGNOSIS — J3089 Other allergic rhinitis: Secondary | ICD-10-CM | POA: Diagnosis not present

## 2021-09-17 DIAGNOSIS — J3089 Other allergic rhinitis: Secondary | ICD-10-CM | POA: Diagnosis not present

## 2021-09-17 DIAGNOSIS — J301 Allergic rhinitis due to pollen: Secondary | ICD-10-CM | POA: Diagnosis not present

## 2021-09-25 DIAGNOSIS — J301 Allergic rhinitis due to pollen: Secondary | ICD-10-CM | POA: Diagnosis not present

## 2021-09-25 DIAGNOSIS — J3089 Other allergic rhinitis: Secondary | ICD-10-CM | POA: Diagnosis not present

## 2021-10-01 DIAGNOSIS — J301 Allergic rhinitis due to pollen: Secondary | ICD-10-CM | POA: Diagnosis not present

## 2021-10-01 DIAGNOSIS — J3089 Other allergic rhinitis: Secondary | ICD-10-CM | POA: Diagnosis not present

## 2021-10-02 DIAGNOSIS — J301 Allergic rhinitis due to pollen: Secondary | ICD-10-CM | POA: Diagnosis not present

## 2021-10-02 DIAGNOSIS — J3089 Other allergic rhinitis: Secondary | ICD-10-CM | POA: Diagnosis not present

## 2021-10-09 DIAGNOSIS — J301 Allergic rhinitis due to pollen: Secondary | ICD-10-CM | POA: Diagnosis not present

## 2021-10-09 DIAGNOSIS — J3089 Other allergic rhinitis: Secondary | ICD-10-CM | POA: Diagnosis not present

## 2021-10-15 DIAGNOSIS — J301 Allergic rhinitis due to pollen: Secondary | ICD-10-CM | POA: Diagnosis not present

## 2021-10-15 DIAGNOSIS — J3089 Other allergic rhinitis: Secondary | ICD-10-CM | POA: Diagnosis not present

## 2021-10-17 DIAGNOSIS — J301 Allergic rhinitis due to pollen: Secondary | ICD-10-CM | POA: Diagnosis not present

## 2021-10-17 DIAGNOSIS — J3089 Other allergic rhinitis: Secondary | ICD-10-CM | POA: Diagnosis not present

## 2021-10-21 DIAGNOSIS — J3089 Other allergic rhinitis: Secondary | ICD-10-CM | POA: Diagnosis not present

## 2021-10-21 DIAGNOSIS — J301 Allergic rhinitis due to pollen: Secondary | ICD-10-CM | POA: Diagnosis not present

## 2021-10-23 DIAGNOSIS — J301 Allergic rhinitis due to pollen: Secondary | ICD-10-CM | POA: Diagnosis not present

## 2021-10-23 DIAGNOSIS — J3089 Other allergic rhinitis: Secondary | ICD-10-CM | POA: Diagnosis not present

## 2021-10-29 DIAGNOSIS — J301 Allergic rhinitis due to pollen: Secondary | ICD-10-CM | POA: Diagnosis not present

## 2021-10-29 DIAGNOSIS — J3089 Other allergic rhinitis: Secondary | ICD-10-CM | POA: Diagnosis not present

## 2021-11-05 DIAGNOSIS — J301 Allergic rhinitis due to pollen: Secondary | ICD-10-CM | POA: Diagnosis not present

## 2021-11-05 DIAGNOSIS — J3089 Other allergic rhinitis: Secondary | ICD-10-CM | POA: Diagnosis not present

## 2021-11-12 DIAGNOSIS — J3089 Other allergic rhinitis: Secondary | ICD-10-CM | POA: Diagnosis not present

## 2021-11-12 DIAGNOSIS — J301 Allergic rhinitis due to pollen: Secondary | ICD-10-CM | POA: Diagnosis not present

## 2021-11-19 DIAGNOSIS — J3089 Other allergic rhinitis: Secondary | ICD-10-CM | POA: Diagnosis not present

## 2021-11-19 DIAGNOSIS — J301 Allergic rhinitis due to pollen: Secondary | ICD-10-CM | POA: Diagnosis not present

## 2021-11-19 DIAGNOSIS — J3081 Allergic rhinitis due to animal (cat) (dog) hair and dander: Secondary | ICD-10-CM | POA: Diagnosis not present

## 2021-11-26 DIAGNOSIS — J301 Allergic rhinitis due to pollen: Secondary | ICD-10-CM | POA: Diagnosis not present

## 2021-11-26 DIAGNOSIS — J3089 Other allergic rhinitis: Secondary | ICD-10-CM | POA: Diagnosis not present

## 2021-12-03 DIAGNOSIS — J301 Allergic rhinitis due to pollen: Secondary | ICD-10-CM | POA: Diagnosis not present

## 2021-12-03 DIAGNOSIS — J3089 Other allergic rhinitis: Secondary | ICD-10-CM | POA: Diagnosis not present

## 2021-12-10 DIAGNOSIS — J3089 Other allergic rhinitis: Secondary | ICD-10-CM | POA: Diagnosis not present

## 2021-12-10 DIAGNOSIS — J301 Allergic rhinitis due to pollen: Secondary | ICD-10-CM | POA: Diagnosis not present

## 2021-12-17 DIAGNOSIS — J3089 Other allergic rhinitis: Secondary | ICD-10-CM | POA: Diagnosis not present

## 2021-12-17 DIAGNOSIS — J3081 Allergic rhinitis due to animal (cat) (dog) hair and dander: Secondary | ICD-10-CM | POA: Diagnosis not present

## 2021-12-17 DIAGNOSIS — J301 Allergic rhinitis due to pollen: Secondary | ICD-10-CM | POA: Diagnosis not present

## 2021-12-24 DIAGNOSIS — J3081 Allergic rhinitis due to animal (cat) (dog) hair and dander: Secondary | ICD-10-CM | POA: Diagnosis not present

## 2021-12-24 DIAGNOSIS — J301 Allergic rhinitis due to pollen: Secondary | ICD-10-CM | POA: Diagnosis not present

## 2021-12-24 DIAGNOSIS — J3089 Other allergic rhinitis: Secondary | ICD-10-CM | POA: Diagnosis not present

## 2021-12-31 DIAGNOSIS — J3081 Allergic rhinitis due to animal (cat) (dog) hair and dander: Secondary | ICD-10-CM | POA: Diagnosis not present

## 2021-12-31 DIAGNOSIS — J301 Allergic rhinitis due to pollen: Secondary | ICD-10-CM | POA: Diagnosis not present

## 2021-12-31 DIAGNOSIS — J3089 Other allergic rhinitis: Secondary | ICD-10-CM | POA: Diagnosis not present

## 2022-01-06 DIAGNOSIS — E1169 Type 2 diabetes mellitus with other specified complication: Secondary | ICD-10-CM | POA: Diagnosis not present

## 2022-01-06 DIAGNOSIS — I10 Essential (primary) hypertension: Secondary | ICD-10-CM | POA: Diagnosis not present

## 2022-01-06 DIAGNOSIS — M1990 Unspecified osteoarthritis, unspecified site: Secondary | ICD-10-CM | POA: Diagnosis not present

## 2022-01-06 DIAGNOSIS — E663 Overweight: Secondary | ICD-10-CM | POA: Diagnosis not present

## 2022-01-06 DIAGNOSIS — M810 Age-related osteoporosis without current pathological fracture: Secondary | ICD-10-CM | POA: Diagnosis not present

## 2022-01-06 DIAGNOSIS — M5430 Sciatica, unspecified side: Secondary | ICD-10-CM | POA: Diagnosis not present

## 2022-01-08 DIAGNOSIS — J301 Allergic rhinitis due to pollen: Secondary | ICD-10-CM | POA: Diagnosis not present

## 2022-01-08 DIAGNOSIS — J3089 Other allergic rhinitis: Secondary | ICD-10-CM | POA: Diagnosis not present

## 2022-01-14 DIAGNOSIS — J301 Allergic rhinitis due to pollen: Secondary | ICD-10-CM | POA: Diagnosis not present

## 2022-01-14 DIAGNOSIS — J3089 Other allergic rhinitis: Secondary | ICD-10-CM | POA: Diagnosis not present

## 2022-01-14 DIAGNOSIS — J3081 Allergic rhinitis due to animal (cat) (dog) hair and dander: Secondary | ICD-10-CM | POA: Diagnosis not present

## 2022-01-17 DIAGNOSIS — Z0142 Encounter for cervical smear to confirm findings of recent normal smear following initial abnormal smear: Secondary | ICD-10-CM | POA: Diagnosis not present

## 2022-01-17 DIAGNOSIS — Z6829 Body mass index (BMI) 29.0-29.9, adult: Secondary | ICD-10-CM | POA: Diagnosis not present

## 2022-01-17 DIAGNOSIS — Z01411 Encounter for gynecological examination (general) (routine) with abnormal findings: Secondary | ICD-10-CM | POA: Diagnosis not present

## 2022-01-17 DIAGNOSIS — Z124 Encounter for screening for malignant neoplasm of cervix: Secondary | ICD-10-CM | POA: Diagnosis not present

## 2022-01-17 DIAGNOSIS — Z01419 Encounter for gynecological examination (general) (routine) without abnormal findings: Secondary | ICD-10-CM | POA: Diagnosis not present

## 2022-01-21 DIAGNOSIS — J3089 Other allergic rhinitis: Secondary | ICD-10-CM | POA: Diagnosis not present

## 2022-01-21 DIAGNOSIS — J301 Allergic rhinitis due to pollen: Secondary | ICD-10-CM | POA: Diagnosis not present

## 2022-01-21 DIAGNOSIS — J3081 Allergic rhinitis due to animal (cat) (dog) hair and dander: Secondary | ICD-10-CM | POA: Diagnosis not present

## 2022-01-28 DIAGNOSIS — J301 Allergic rhinitis due to pollen: Secondary | ICD-10-CM | POA: Diagnosis not present

## 2022-01-28 DIAGNOSIS — J3089 Other allergic rhinitis: Secondary | ICD-10-CM | POA: Diagnosis not present

## 2022-01-28 DIAGNOSIS — J3081 Allergic rhinitis due to animal (cat) (dog) hair and dander: Secondary | ICD-10-CM | POA: Diagnosis not present

## 2022-02-04 DIAGNOSIS — J3081 Allergic rhinitis due to animal (cat) (dog) hair and dander: Secondary | ICD-10-CM | POA: Diagnosis not present

## 2022-02-04 DIAGNOSIS — J3089 Other allergic rhinitis: Secondary | ICD-10-CM | POA: Diagnosis not present

## 2022-02-04 DIAGNOSIS — J301 Allergic rhinitis due to pollen: Secondary | ICD-10-CM | POA: Diagnosis not present

## 2022-02-11 DIAGNOSIS — J3089 Other allergic rhinitis: Secondary | ICD-10-CM | POA: Diagnosis not present

## 2022-02-11 DIAGNOSIS — J301 Allergic rhinitis due to pollen: Secondary | ICD-10-CM | POA: Diagnosis not present

## 2022-02-11 DIAGNOSIS — J3081 Allergic rhinitis due to animal (cat) (dog) hair and dander: Secondary | ICD-10-CM | POA: Diagnosis not present

## 2022-02-18 DIAGNOSIS — J301 Allergic rhinitis due to pollen: Secondary | ICD-10-CM | POA: Diagnosis not present

## 2022-02-18 DIAGNOSIS — J3089 Other allergic rhinitis: Secondary | ICD-10-CM | POA: Diagnosis not present

## 2022-02-18 DIAGNOSIS — J3081 Allergic rhinitis due to animal (cat) (dog) hair and dander: Secondary | ICD-10-CM | POA: Diagnosis not present

## 2022-02-25 DIAGNOSIS — J3081 Allergic rhinitis due to animal (cat) (dog) hair and dander: Secondary | ICD-10-CM | POA: Diagnosis not present

## 2022-02-25 DIAGNOSIS — J3089 Other allergic rhinitis: Secondary | ICD-10-CM | POA: Diagnosis not present

## 2022-02-25 DIAGNOSIS — J301 Allergic rhinitis due to pollen: Secondary | ICD-10-CM | POA: Diagnosis not present

## 2022-03-04 DIAGNOSIS — J3089 Other allergic rhinitis: Secondary | ICD-10-CM | POA: Diagnosis not present

## 2022-03-04 DIAGNOSIS — J301 Allergic rhinitis due to pollen: Secondary | ICD-10-CM | POA: Diagnosis not present

## 2022-03-06 DIAGNOSIS — J301 Allergic rhinitis due to pollen: Secondary | ICD-10-CM | POA: Diagnosis not present

## 2022-03-06 DIAGNOSIS — J3089 Other allergic rhinitis: Secondary | ICD-10-CM | POA: Diagnosis not present

## 2022-03-11 DIAGNOSIS — J301 Allergic rhinitis due to pollen: Secondary | ICD-10-CM | POA: Diagnosis not present

## 2022-03-11 DIAGNOSIS — J3089 Other allergic rhinitis: Secondary | ICD-10-CM | POA: Diagnosis not present

## 2022-03-11 DIAGNOSIS — J3081 Allergic rhinitis due to animal (cat) (dog) hair and dander: Secondary | ICD-10-CM | POA: Diagnosis not present

## 2022-03-14 DIAGNOSIS — J3089 Other allergic rhinitis: Secondary | ICD-10-CM | POA: Diagnosis not present

## 2022-03-14 DIAGNOSIS — J301 Allergic rhinitis due to pollen: Secondary | ICD-10-CM | POA: Diagnosis not present

## 2022-03-14 DIAGNOSIS — J3081 Allergic rhinitis due to animal (cat) (dog) hair and dander: Secondary | ICD-10-CM | POA: Diagnosis not present

## 2022-03-18 DIAGNOSIS — J3081 Allergic rhinitis due to animal (cat) (dog) hair and dander: Secondary | ICD-10-CM | POA: Diagnosis not present

## 2022-03-18 DIAGNOSIS — J3089 Other allergic rhinitis: Secondary | ICD-10-CM | POA: Diagnosis not present

## 2022-03-18 DIAGNOSIS — J301 Allergic rhinitis due to pollen: Secondary | ICD-10-CM | POA: Diagnosis not present

## 2022-03-21 DIAGNOSIS — J3089 Other allergic rhinitis: Secondary | ICD-10-CM | POA: Diagnosis not present

## 2022-03-21 DIAGNOSIS — J3081 Allergic rhinitis due to animal (cat) (dog) hair and dander: Secondary | ICD-10-CM | POA: Diagnosis not present

## 2022-03-21 DIAGNOSIS — J301 Allergic rhinitis due to pollen: Secondary | ICD-10-CM | POA: Diagnosis not present

## 2022-03-26 DIAGNOSIS — J301 Allergic rhinitis due to pollen: Secondary | ICD-10-CM | POA: Diagnosis not present

## 2022-03-26 DIAGNOSIS — J3089 Other allergic rhinitis: Secondary | ICD-10-CM | POA: Diagnosis not present

## 2022-03-26 DIAGNOSIS — J3081 Allergic rhinitis due to animal (cat) (dog) hair and dander: Secondary | ICD-10-CM | POA: Diagnosis not present

## 2022-04-01 DIAGNOSIS — J3089 Other allergic rhinitis: Secondary | ICD-10-CM | POA: Diagnosis not present

## 2022-04-01 DIAGNOSIS — J301 Allergic rhinitis due to pollen: Secondary | ICD-10-CM | POA: Diagnosis not present

## 2022-04-04 DIAGNOSIS — E113292 Type 2 diabetes mellitus with mild nonproliferative diabetic retinopathy without macular edema, left eye: Secondary | ICD-10-CM | POA: Diagnosis not present

## 2022-04-04 DIAGNOSIS — H5203 Hypermetropia, bilateral: Secondary | ICD-10-CM | POA: Diagnosis not present

## 2022-04-04 DIAGNOSIS — H2513 Age-related nuclear cataract, bilateral: Secondary | ICD-10-CM | POA: Diagnosis not present

## 2022-04-04 DIAGNOSIS — H353132 Nonexudative age-related macular degeneration, bilateral, intermediate dry stage: Secondary | ICD-10-CM | POA: Diagnosis not present

## 2022-04-11 DIAGNOSIS — J3081 Allergic rhinitis due to animal (cat) (dog) hair and dander: Secondary | ICD-10-CM | POA: Diagnosis not present

## 2022-04-11 DIAGNOSIS — J301 Allergic rhinitis due to pollen: Secondary | ICD-10-CM | POA: Diagnosis not present

## 2022-04-11 DIAGNOSIS — J3089 Other allergic rhinitis: Secondary | ICD-10-CM | POA: Diagnosis not present

## 2022-04-21 DIAGNOSIS — J301 Allergic rhinitis due to pollen: Secondary | ICD-10-CM | POA: Diagnosis not present

## 2022-04-21 DIAGNOSIS — J3089 Other allergic rhinitis: Secondary | ICD-10-CM | POA: Diagnosis not present

## 2022-04-22 DIAGNOSIS — Z791 Long term (current) use of non-steroidal anti-inflammatories (NSAID): Secondary | ICD-10-CM | POA: Diagnosis not present

## 2022-04-22 DIAGNOSIS — J301 Allergic rhinitis due to pollen: Secondary | ICD-10-CM | POA: Diagnosis not present

## 2022-04-22 DIAGNOSIS — Z7982 Long term (current) use of aspirin: Secondary | ICD-10-CM | POA: Diagnosis not present

## 2022-04-22 DIAGNOSIS — M1990 Unspecified osteoarthritis, unspecified site: Secondary | ICD-10-CM | POA: Diagnosis not present

## 2022-04-22 DIAGNOSIS — E785 Hyperlipidemia, unspecified: Secondary | ICD-10-CM | POA: Diagnosis not present

## 2022-04-22 DIAGNOSIS — E119 Type 2 diabetes mellitus without complications: Secondary | ICD-10-CM | POA: Diagnosis not present

## 2022-04-22 DIAGNOSIS — I10 Essential (primary) hypertension: Secondary | ICD-10-CM | POA: Diagnosis not present

## 2022-04-22 DIAGNOSIS — B3731 Acute candidiasis of vulva and vagina: Secondary | ICD-10-CM | POA: Diagnosis not present

## 2022-04-22 DIAGNOSIS — M8580 Other specified disorders of bone density and structure, unspecified site: Secondary | ICD-10-CM | POA: Diagnosis not present

## 2022-04-29 DIAGNOSIS — J301 Allergic rhinitis due to pollen: Secondary | ICD-10-CM | POA: Diagnosis not present

## 2022-04-29 DIAGNOSIS — J3081 Allergic rhinitis due to animal (cat) (dog) hair and dander: Secondary | ICD-10-CM | POA: Diagnosis not present

## 2022-04-29 DIAGNOSIS — J3089 Other allergic rhinitis: Secondary | ICD-10-CM | POA: Diagnosis not present

## 2022-05-05 DIAGNOSIS — M810 Age-related osteoporosis without current pathological fracture: Secondary | ICD-10-CM | POA: Diagnosis not present

## 2022-05-05 DIAGNOSIS — Z23 Encounter for immunization: Secondary | ICD-10-CM | POA: Diagnosis not present

## 2022-05-05 DIAGNOSIS — E1169 Type 2 diabetes mellitus with other specified complication: Secondary | ICD-10-CM | POA: Diagnosis not present

## 2022-05-05 DIAGNOSIS — M5430 Sciatica, unspecified side: Secondary | ICD-10-CM | POA: Diagnosis not present

## 2022-05-05 DIAGNOSIS — I10 Essential (primary) hypertension: Secondary | ICD-10-CM | POA: Diagnosis not present

## 2022-05-06 DIAGNOSIS — J3081 Allergic rhinitis due to animal (cat) (dog) hair and dander: Secondary | ICD-10-CM | POA: Diagnosis not present

## 2022-05-06 DIAGNOSIS — J301 Allergic rhinitis due to pollen: Secondary | ICD-10-CM | POA: Diagnosis not present

## 2022-05-06 DIAGNOSIS — J3089 Other allergic rhinitis: Secondary | ICD-10-CM | POA: Diagnosis not present

## 2022-05-13 DIAGNOSIS — J3081 Allergic rhinitis due to animal (cat) (dog) hair and dander: Secondary | ICD-10-CM | POA: Diagnosis not present

## 2022-05-13 DIAGNOSIS — J301 Allergic rhinitis due to pollen: Secondary | ICD-10-CM | POA: Diagnosis not present

## 2022-05-13 DIAGNOSIS — J3089 Other allergic rhinitis: Secondary | ICD-10-CM | POA: Diagnosis not present

## 2022-05-14 ENCOUNTER — Other Ambulatory Visit: Payer: Self-pay | Admitting: Internal Medicine

## 2022-05-14 DIAGNOSIS — M545 Low back pain, unspecified: Secondary | ICD-10-CM

## 2022-05-20 DIAGNOSIS — J301 Allergic rhinitis due to pollen: Secondary | ICD-10-CM | POA: Diagnosis not present

## 2022-05-20 DIAGNOSIS — J3089 Other allergic rhinitis: Secondary | ICD-10-CM | POA: Diagnosis not present

## 2022-05-20 DIAGNOSIS — J3081 Allergic rhinitis due to animal (cat) (dog) hair and dander: Secondary | ICD-10-CM | POA: Diagnosis not present

## 2022-05-27 DIAGNOSIS — J301 Allergic rhinitis due to pollen: Secondary | ICD-10-CM | POA: Diagnosis not present

## 2022-05-27 DIAGNOSIS — J3081 Allergic rhinitis due to animal (cat) (dog) hair and dander: Secondary | ICD-10-CM | POA: Diagnosis not present

## 2022-05-27 DIAGNOSIS — J3089 Other allergic rhinitis: Secondary | ICD-10-CM | POA: Diagnosis not present

## 2022-05-29 ENCOUNTER — Other Ambulatory Visit: Payer: Medicare Other

## 2022-05-30 DIAGNOSIS — J301 Allergic rhinitis due to pollen: Secondary | ICD-10-CM | POA: Diagnosis not present

## 2022-05-30 DIAGNOSIS — T63441D Toxic effect of venom of bees, accidental (unintentional), subsequent encounter: Secondary | ICD-10-CM | POA: Diagnosis not present

## 2022-05-30 DIAGNOSIS — J3081 Allergic rhinitis due to animal (cat) (dog) hair and dander: Secondary | ICD-10-CM | POA: Diagnosis not present

## 2022-05-30 DIAGNOSIS — J3089 Other allergic rhinitis: Secondary | ICD-10-CM | POA: Diagnosis not present

## 2022-06-03 DIAGNOSIS — J3081 Allergic rhinitis due to animal (cat) (dog) hair and dander: Secondary | ICD-10-CM | POA: Diagnosis not present

## 2022-06-03 DIAGNOSIS — J301 Allergic rhinitis due to pollen: Secondary | ICD-10-CM | POA: Diagnosis not present

## 2022-06-03 DIAGNOSIS — J3089 Other allergic rhinitis: Secondary | ICD-10-CM | POA: Diagnosis not present

## 2022-06-04 ENCOUNTER — Ambulatory Visit
Admission: RE | Admit: 2022-06-04 | Discharge: 2022-06-04 | Disposition: A | Discharge status: Home / Self Care | Payer: Medicare PPO | Source: Ambulatory Visit | Attending: Internal Medicine | Admitting: Internal Medicine

## 2022-06-04 DIAGNOSIS — M5126 Other intervertebral disc displacement, lumbar region: Secondary | ICD-10-CM | POA: Diagnosis not present

## 2022-06-04 DIAGNOSIS — M4316 Spondylolisthesis, lumbar region: Secondary | ICD-10-CM | POA: Diagnosis not present

## 2022-06-04 DIAGNOSIS — M48061 Spinal stenosis, lumbar region without neurogenic claudication: Secondary | ICD-10-CM | POA: Diagnosis not present

## 2022-06-04 DIAGNOSIS — M4186 Other forms of scoliosis, lumbar region: Secondary | ICD-10-CM | POA: Diagnosis not present

## 2022-06-04 DIAGNOSIS — M545 Low back pain, unspecified: Secondary | ICD-10-CM

## 2022-06-04 MED ORDER — GADOBENATE DIMEGLUMINE 529 MG/ML IV SOLN
14.0000 mL | Freq: Once | INTRAVENOUS | Status: AC | PRN
  Administered 2022-06-04: 14 mL via INTRAVENOUS

## 2022-06-10 DIAGNOSIS — J301 Allergic rhinitis due to pollen: Secondary | ICD-10-CM | POA: Diagnosis not present

## 2022-06-10 DIAGNOSIS — J3081 Allergic rhinitis due to animal (cat) (dog) hair and dander: Secondary | ICD-10-CM | POA: Diagnosis not present

## 2022-06-10 DIAGNOSIS — J3089 Other allergic rhinitis: Secondary | ICD-10-CM | POA: Diagnosis not present

## 2022-06-17 DIAGNOSIS — J301 Allergic rhinitis due to pollen: Secondary | ICD-10-CM | POA: Diagnosis not present

## 2022-06-17 DIAGNOSIS — J3089 Other allergic rhinitis: Secondary | ICD-10-CM | POA: Diagnosis not present

## 2022-06-17 DIAGNOSIS — J3081 Allergic rhinitis due to animal (cat) (dog) hair and dander: Secondary | ICD-10-CM | POA: Diagnosis not present

## 2022-06-19 DIAGNOSIS — M48061 Spinal stenosis, lumbar region without neurogenic claudication: Secondary | ICD-10-CM | POA: Diagnosis not present

## 2022-06-24 DIAGNOSIS — J3089 Other allergic rhinitis: Secondary | ICD-10-CM | POA: Diagnosis not present

## 2022-06-24 DIAGNOSIS — J301 Allergic rhinitis due to pollen: Secondary | ICD-10-CM | POA: Diagnosis not present

## 2022-06-25 DIAGNOSIS — M2569 Stiffness of other specified joint, not elsewhere classified: Secondary | ICD-10-CM | POA: Diagnosis not present

## 2022-06-25 DIAGNOSIS — M48061 Spinal stenosis, lumbar region without neurogenic claudication: Secondary | ICD-10-CM | POA: Diagnosis not present

## 2022-06-25 DIAGNOSIS — M545 Low back pain, unspecified: Secondary | ICD-10-CM | POA: Diagnosis not present

## 2022-06-25 DIAGNOSIS — M6281 Muscle weakness (generalized): Secondary | ICD-10-CM | POA: Diagnosis not present

## 2022-06-27 DIAGNOSIS — M48061 Spinal stenosis, lumbar region without neurogenic claudication: Secondary | ICD-10-CM | POA: Diagnosis not present

## 2022-06-27 DIAGNOSIS — M6281 Muscle weakness (generalized): Secondary | ICD-10-CM | POA: Diagnosis not present

## 2022-06-27 DIAGNOSIS — M545 Low back pain, unspecified: Secondary | ICD-10-CM | POA: Diagnosis not present

## 2022-06-27 DIAGNOSIS — M2569 Stiffness of other specified joint, not elsewhere classified: Secondary | ICD-10-CM | POA: Diagnosis not present

## 2022-06-30 DIAGNOSIS — M48061 Spinal stenosis, lumbar region without neurogenic claudication: Secondary | ICD-10-CM | POA: Diagnosis not present

## 2022-06-30 DIAGNOSIS — M6281 Muscle weakness (generalized): Secondary | ICD-10-CM | POA: Diagnosis not present

## 2022-06-30 DIAGNOSIS — M545 Low back pain, unspecified: Secondary | ICD-10-CM | POA: Diagnosis not present

## 2022-06-30 DIAGNOSIS — M2569 Stiffness of other specified joint, not elsewhere classified: Secondary | ICD-10-CM | POA: Diagnosis not present

## 2022-07-01 DIAGNOSIS — J3081 Allergic rhinitis due to animal (cat) (dog) hair and dander: Secondary | ICD-10-CM | POA: Diagnosis not present

## 2022-07-01 DIAGNOSIS — J3089 Other allergic rhinitis: Secondary | ICD-10-CM | POA: Diagnosis not present

## 2022-07-01 DIAGNOSIS — J301 Allergic rhinitis due to pollen: Secondary | ICD-10-CM | POA: Diagnosis not present

## 2022-07-02 DIAGNOSIS — M48061 Spinal stenosis, lumbar region without neurogenic claudication: Secondary | ICD-10-CM | POA: Diagnosis not present

## 2022-07-02 DIAGNOSIS — M6281 Muscle weakness (generalized): Secondary | ICD-10-CM | POA: Diagnosis not present

## 2022-07-02 DIAGNOSIS — M545 Low back pain, unspecified: Secondary | ICD-10-CM | POA: Diagnosis not present

## 2022-07-02 DIAGNOSIS — M2569 Stiffness of other specified joint, not elsewhere classified: Secondary | ICD-10-CM | POA: Diagnosis not present

## 2022-07-04 DIAGNOSIS — M2569 Stiffness of other specified joint, not elsewhere classified: Secondary | ICD-10-CM | POA: Diagnosis not present

## 2022-07-04 DIAGNOSIS — M6281 Muscle weakness (generalized): Secondary | ICD-10-CM | POA: Diagnosis not present

## 2022-07-04 DIAGNOSIS — M545 Low back pain, unspecified: Secondary | ICD-10-CM | POA: Diagnosis not present

## 2022-07-04 DIAGNOSIS — M48061 Spinal stenosis, lumbar region without neurogenic claudication: Secondary | ICD-10-CM | POA: Diagnosis not present

## 2022-07-07 DIAGNOSIS — M6281 Muscle weakness (generalized): Secondary | ICD-10-CM | POA: Diagnosis not present

## 2022-07-07 DIAGNOSIS — M2569 Stiffness of other specified joint, not elsewhere classified: Secondary | ICD-10-CM | POA: Diagnosis not present

## 2022-07-07 DIAGNOSIS — M48061 Spinal stenosis, lumbar region without neurogenic claudication: Secondary | ICD-10-CM | POA: Diagnosis not present

## 2022-07-07 DIAGNOSIS — M545 Low back pain, unspecified: Secondary | ICD-10-CM | POA: Diagnosis not present

## 2022-07-08 DIAGNOSIS — J3089 Other allergic rhinitis: Secondary | ICD-10-CM | POA: Diagnosis not present

## 2022-07-08 DIAGNOSIS — J301 Allergic rhinitis due to pollen: Secondary | ICD-10-CM | POA: Diagnosis not present

## 2022-07-08 DIAGNOSIS — J3081 Allergic rhinitis due to animal (cat) (dog) hair and dander: Secondary | ICD-10-CM | POA: Diagnosis not present

## 2022-07-09 DIAGNOSIS — M6281 Muscle weakness (generalized): Secondary | ICD-10-CM | POA: Diagnosis not present

## 2022-07-09 DIAGNOSIS — M48061 Spinal stenosis, lumbar region without neurogenic claudication: Secondary | ICD-10-CM | POA: Diagnosis not present

## 2022-07-09 DIAGNOSIS — M545 Low back pain, unspecified: Secondary | ICD-10-CM | POA: Diagnosis not present

## 2022-07-09 DIAGNOSIS — M2569 Stiffness of other specified joint, not elsewhere classified: Secondary | ICD-10-CM | POA: Diagnosis not present

## 2022-07-11 DIAGNOSIS — M48061 Spinal stenosis, lumbar region without neurogenic claudication: Secondary | ICD-10-CM | POA: Diagnosis not present

## 2022-07-11 DIAGNOSIS — M6281 Muscle weakness (generalized): Secondary | ICD-10-CM | POA: Diagnosis not present

## 2022-07-11 DIAGNOSIS — M2569 Stiffness of other specified joint, not elsewhere classified: Secondary | ICD-10-CM | POA: Diagnosis not present

## 2022-07-11 DIAGNOSIS — M545 Low back pain, unspecified: Secondary | ICD-10-CM | POA: Diagnosis not present

## 2022-07-14 DIAGNOSIS — M6281 Muscle weakness (generalized): Secondary | ICD-10-CM | POA: Diagnosis not present

## 2022-07-14 DIAGNOSIS — M545 Low back pain, unspecified: Secondary | ICD-10-CM | POA: Diagnosis not present

## 2022-07-14 DIAGNOSIS — M48061 Spinal stenosis, lumbar region without neurogenic claudication: Secondary | ICD-10-CM | POA: Diagnosis not present

## 2022-07-14 DIAGNOSIS — M2569 Stiffness of other specified joint, not elsewhere classified: Secondary | ICD-10-CM | POA: Diagnosis not present

## 2022-07-15 DIAGNOSIS — J3089 Other allergic rhinitis: Secondary | ICD-10-CM | POA: Diagnosis not present

## 2022-07-15 DIAGNOSIS — J301 Allergic rhinitis due to pollen: Secondary | ICD-10-CM | POA: Diagnosis not present

## 2022-07-15 DIAGNOSIS — J3081 Allergic rhinitis due to animal (cat) (dog) hair and dander: Secondary | ICD-10-CM | POA: Diagnosis not present

## 2022-07-16 DIAGNOSIS — M545 Low back pain, unspecified: Secondary | ICD-10-CM | POA: Diagnosis not present

## 2022-07-16 DIAGNOSIS — M48061 Spinal stenosis, lumbar region without neurogenic claudication: Secondary | ICD-10-CM | POA: Diagnosis not present

## 2022-07-16 DIAGNOSIS — M6281 Muscle weakness (generalized): Secondary | ICD-10-CM | POA: Diagnosis not present

## 2022-07-16 DIAGNOSIS — M2569 Stiffness of other specified joint, not elsewhere classified: Secondary | ICD-10-CM | POA: Diagnosis not present

## 2022-07-22 DIAGNOSIS — M2569 Stiffness of other specified joint, not elsewhere classified: Secondary | ICD-10-CM | POA: Diagnosis not present

## 2022-07-22 DIAGNOSIS — M545 Low back pain, unspecified: Secondary | ICD-10-CM | POA: Diagnosis not present

## 2022-07-22 DIAGNOSIS — J301 Allergic rhinitis due to pollen: Secondary | ICD-10-CM | POA: Diagnosis not present

## 2022-07-22 DIAGNOSIS — M6281 Muscle weakness (generalized): Secondary | ICD-10-CM | POA: Diagnosis not present

## 2022-07-22 DIAGNOSIS — M48061 Spinal stenosis, lumbar region without neurogenic claudication: Secondary | ICD-10-CM | POA: Diagnosis not present

## 2022-07-22 DIAGNOSIS — J3089 Other allergic rhinitis: Secondary | ICD-10-CM | POA: Diagnosis not present

## 2022-07-24 DIAGNOSIS — M2569 Stiffness of other specified joint, not elsewhere classified: Secondary | ICD-10-CM | POA: Diagnosis not present

## 2022-07-24 DIAGNOSIS — M48061 Spinal stenosis, lumbar region without neurogenic claudication: Secondary | ICD-10-CM | POA: Diagnosis not present

## 2022-07-24 DIAGNOSIS — M6281 Muscle weakness (generalized): Secondary | ICD-10-CM | POA: Diagnosis not present

## 2022-07-24 DIAGNOSIS — M545 Low back pain, unspecified: Secondary | ICD-10-CM | POA: Diagnosis not present

## 2022-07-28 DIAGNOSIS — M48061 Spinal stenosis, lumbar region without neurogenic claudication: Secondary | ICD-10-CM | POA: Diagnosis not present

## 2022-07-28 DIAGNOSIS — M6281 Muscle weakness (generalized): Secondary | ICD-10-CM | POA: Diagnosis not present

## 2022-07-28 DIAGNOSIS — M545 Low back pain, unspecified: Secondary | ICD-10-CM | POA: Diagnosis not present

## 2022-07-28 DIAGNOSIS — M2569 Stiffness of other specified joint, not elsewhere classified: Secondary | ICD-10-CM | POA: Diagnosis not present

## 2022-07-29 DIAGNOSIS — J3081 Allergic rhinitis due to animal (cat) (dog) hair and dander: Secondary | ICD-10-CM | POA: Diagnosis not present

## 2022-07-29 DIAGNOSIS — J301 Allergic rhinitis due to pollen: Secondary | ICD-10-CM | POA: Diagnosis not present

## 2022-07-29 DIAGNOSIS — J3089 Other allergic rhinitis: Secondary | ICD-10-CM | POA: Diagnosis not present

## 2022-07-30 DIAGNOSIS — M48061 Spinal stenosis, lumbar region without neurogenic claudication: Secondary | ICD-10-CM | POA: Diagnosis not present

## 2022-07-30 DIAGNOSIS — M2569 Stiffness of other specified joint, not elsewhere classified: Secondary | ICD-10-CM | POA: Diagnosis not present

## 2022-07-30 DIAGNOSIS — M545 Low back pain, unspecified: Secondary | ICD-10-CM | POA: Diagnosis not present

## 2022-07-30 DIAGNOSIS — M6281 Muscle weakness (generalized): Secondary | ICD-10-CM | POA: Diagnosis not present

## 2022-08-04 DIAGNOSIS — M48061 Spinal stenosis, lumbar region without neurogenic claudication: Secondary | ICD-10-CM | POA: Diagnosis not present

## 2022-08-04 DIAGNOSIS — M2569 Stiffness of other specified joint, not elsewhere classified: Secondary | ICD-10-CM | POA: Diagnosis not present

## 2022-08-04 DIAGNOSIS — M6281 Muscle weakness (generalized): Secondary | ICD-10-CM | POA: Diagnosis not present

## 2022-08-04 DIAGNOSIS — M545 Low back pain, unspecified: Secondary | ICD-10-CM | POA: Diagnosis not present

## 2022-08-05 DIAGNOSIS — J3089 Other allergic rhinitis: Secondary | ICD-10-CM | POA: Diagnosis not present

## 2022-08-05 DIAGNOSIS — J3081 Allergic rhinitis due to animal (cat) (dog) hair and dander: Secondary | ICD-10-CM | POA: Diagnosis not present

## 2022-08-05 DIAGNOSIS — J301 Allergic rhinitis due to pollen: Secondary | ICD-10-CM | POA: Diagnosis not present

## 2022-08-06 DIAGNOSIS — M2569 Stiffness of other specified joint, not elsewhere classified: Secondary | ICD-10-CM | POA: Diagnosis not present

## 2022-08-06 DIAGNOSIS — M545 Low back pain, unspecified: Secondary | ICD-10-CM | POA: Diagnosis not present

## 2022-08-06 DIAGNOSIS — M6281 Muscle weakness (generalized): Secondary | ICD-10-CM | POA: Diagnosis not present

## 2022-08-06 DIAGNOSIS — M48061 Spinal stenosis, lumbar region without neurogenic claudication: Secondary | ICD-10-CM | POA: Diagnosis not present

## 2022-08-11 DIAGNOSIS — M6281 Muscle weakness (generalized): Secondary | ICD-10-CM | POA: Diagnosis not present

## 2022-08-11 DIAGNOSIS — M545 Low back pain, unspecified: Secondary | ICD-10-CM | POA: Diagnosis not present

## 2022-08-11 DIAGNOSIS — M2569 Stiffness of other specified joint, not elsewhere classified: Secondary | ICD-10-CM | POA: Diagnosis not present

## 2022-08-11 DIAGNOSIS — M48061 Spinal stenosis, lumbar region without neurogenic claudication: Secondary | ICD-10-CM | POA: Diagnosis not present

## 2022-08-12 DIAGNOSIS — M6281 Muscle weakness (generalized): Secondary | ICD-10-CM | POA: Diagnosis not present

## 2022-08-12 DIAGNOSIS — J301 Allergic rhinitis due to pollen: Secondary | ICD-10-CM | POA: Diagnosis not present

## 2022-08-12 DIAGNOSIS — M48061 Spinal stenosis, lumbar region without neurogenic claudication: Secondary | ICD-10-CM | POA: Diagnosis not present

## 2022-08-12 DIAGNOSIS — M2569 Stiffness of other specified joint, not elsewhere classified: Secondary | ICD-10-CM | POA: Diagnosis not present

## 2022-08-12 DIAGNOSIS — M545 Low back pain, unspecified: Secondary | ICD-10-CM | POA: Diagnosis not present

## 2022-08-12 DIAGNOSIS — J3081 Allergic rhinitis due to animal (cat) (dog) hair and dander: Secondary | ICD-10-CM | POA: Diagnosis not present

## 2022-08-12 DIAGNOSIS — J3089 Other allergic rhinitis: Secondary | ICD-10-CM | POA: Diagnosis not present

## 2022-08-18 DIAGNOSIS — M2569 Stiffness of other specified joint, not elsewhere classified: Secondary | ICD-10-CM | POA: Diagnosis not present

## 2022-08-18 DIAGNOSIS — M48061 Spinal stenosis, lumbar region without neurogenic claudication: Secondary | ICD-10-CM | POA: Diagnosis not present

## 2022-08-18 DIAGNOSIS — M6281 Muscle weakness (generalized): Secondary | ICD-10-CM | POA: Diagnosis not present

## 2022-08-18 DIAGNOSIS — M545 Low back pain, unspecified: Secondary | ICD-10-CM | POA: Diagnosis not present

## 2022-08-19 DIAGNOSIS — J3081 Allergic rhinitis due to animal (cat) (dog) hair and dander: Secondary | ICD-10-CM | POA: Diagnosis not present

## 2022-08-19 DIAGNOSIS — J301 Allergic rhinitis due to pollen: Secondary | ICD-10-CM | POA: Diagnosis not present

## 2022-08-19 DIAGNOSIS — J3089 Other allergic rhinitis: Secondary | ICD-10-CM | POA: Diagnosis not present

## 2022-08-20 DIAGNOSIS — Z1231 Encounter for screening mammogram for malignant neoplasm of breast: Secondary | ICD-10-CM | POA: Diagnosis not present

## 2022-08-21 DIAGNOSIS — M6281 Muscle weakness (generalized): Secondary | ICD-10-CM | POA: Diagnosis not present

## 2022-08-21 DIAGNOSIS — M48061 Spinal stenosis, lumbar region without neurogenic claudication: Secondary | ICD-10-CM | POA: Diagnosis not present

## 2022-08-21 DIAGNOSIS — M2569 Stiffness of other specified joint, not elsewhere classified: Secondary | ICD-10-CM | POA: Diagnosis not present

## 2022-08-21 DIAGNOSIS — M545 Low back pain, unspecified: Secondary | ICD-10-CM | POA: Diagnosis not present

## 2022-08-25 DIAGNOSIS — M2569 Stiffness of other specified joint, not elsewhere classified: Secondary | ICD-10-CM | POA: Diagnosis not present

## 2022-08-25 DIAGNOSIS — M6281 Muscle weakness (generalized): Secondary | ICD-10-CM | POA: Diagnosis not present

## 2022-08-25 DIAGNOSIS — M48061 Spinal stenosis, lumbar region without neurogenic claudication: Secondary | ICD-10-CM | POA: Diagnosis not present

## 2022-08-25 DIAGNOSIS — M545 Low back pain, unspecified: Secondary | ICD-10-CM | POA: Diagnosis not present

## 2022-08-26 DIAGNOSIS — J3081 Allergic rhinitis due to animal (cat) (dog) hair and dander: Secondary | ICD-10-CM | POA: Diagnosis not present

## 2022-08-26 DIAGNOSIS — J301 Allergic rhinitis due to pollen: Secondary | ICD-10-CM | POA: Diagnosis not present

## 2022-08-26 DIAGNOSIS — J3089 Other allergic rhinitis: Secondary | ICD-10-CM | POA: Diagnosis not present

## 2022-08-27 DIAGNOSIS — M545 Low back pain, unspecified: Secondary | ICD-10-CM | POA: Diagnosis not present

## 2022-08-27 DIAGNOSIS — M6281 Muscle weakness (generalized): Secondary | ICD-10-CM | POA: Diagnosis not present

## 2022-08-27 DIAGNOSIS — M48061 Spinal stenosis, lumbar region without neurogenic claudication: Secondary | ICD-10-CM | POA: Diagnosis not present

## 2022-08-27 DIAGNOSIS — M2569 Stiffness of other specified joint, not elsewhere classified: Secondary | ICD-10-CM | POA: Diagnosis not present

## 2022-09-01 DIAGNOSIS — E785 Hyperlipidemia, unspecified: Secondary | ICD-10-CM | POA: Diagnosis not present

## 2022-09-01 DIAGNOSIS — E1169 Type 2 diabetes mellitus with other specified complication: Secondary | ICD-10-CM | POA: Diagnosis not present

## 2022-09-01 DIAGNOSIS — M2569 Stiffness of other specified joint, not elsewhere classified: Secondary | ICD-10-CM | POA: Diagnosis not present

## 2022-09-01 DIAGNOSIS — M545 Low back pain, unspecified: Secondary | ICD-10-CM | POA: Diagnosis not present

## 2022-09-01 DIAGNOSIS — M48061 Spinal stenosis, lumbar region without neurogenic claudication: Secondary | ICD-10-CM | POA: Diagnosis not present

## 2022-09-01 DIAGNOSIS — M810 Age-related osteoporosis without current pathological fracture: Secondary | ICD-10-CM | POA: Diagnosis not present

## 2022-09-01 DIAGNOSIS — Z1212 Encounter for screening for malignant neoplasm of rectum: Secondary | ICD-10-CM | POA: Diagnosis not present

## 2022-09-01 DIAGNOSIS — M6281 Muscle weakness (generalized): Secondary | ICD-10-CM | POA: Diagnosis not present

## 2022-09-01 DIAGNOSIS — I10 Essential (primary) hypertension: Secondary | ICD-10-CM | POA: Diagnosis not present

## 2022-09-02 DIAGNOSIS — J301 Allergic rhinitis due to pollen: Secondary | ICD-10-CM | POA: Diagnosis not present

## 2022-09-02 DIAGNOSIS — J3089 Other allergic rhinitis: Secondary | ICD-10-CM | POA: Diagnosis not present

## 2022-09-03 DIAGNOSIS — M545 Low back pain, unspecified: Secondary | ICD-10-CM | POA: Diagnosis not present

## 2022-09-03 DIAGNOSIS — M6281 Muscle weakness (generalized): Secondary | ICD-10-CM | POA: Diagnosis not present

## 2022-09-03 DIAGNOSIS — M48061 Spinal stenosis, lumbar region without neurogenic claudication: Secondary | ICD-10-CM | POA: Diagnosis not present

## 2022-09-03 DIAGNOSIS — M2569 Stiffness of other specified joint, not elsewhere classified: Secondary | ICD-10-CM | POA: Diagnosis not present

## 2022-09-08 DIAGNOSIS — E663 Overweight: Secondary | ICD-10-CM | POA: Diagnosis not present

## 2022-09-08 DIAGNOSIS — Z1331 Encounter for screening for depression: Secondary | ICD-10-CM | POA: Diagnosis not present

## 2022-09-08 DIAGNOSIS — I10 Essential (primary) hypertension: Secondary | ICD-10-CM | POA: Diagnosis not present

## 2022-09-08 DIAGNOSIS — E785 Hyperlipidemia, unspecified: Secondary | ICD-10-CM | POA: Diagnosis not present

## 2022-09-08 DIAGNOSIS — R82998 Other abnormal findings in urine: Secondary | ICD-10-CM | POA: Diagnosis not present

## 2022-09-08 DIAGNOSIS — E1169 Type 2 diabetes mellitus with other specified complication: Secondary | ICD-10-CM | POA: Diagnosis not present

## 2022-09-08 DIAGNOSIS — E119 Type 2 diabetes mellitus without complications: Secondary | ICD-10-CM | POA: Diagnosis not present

## 2022-09-08 DIAGNOSIS — Z0000 Encounter for general adult medical examination without abnormal findings: Secondary | ICD-10-CM | POA: Diagnosis not present

## 2022-09-08 DIAGNOSIS — M545 Low back pain, unspecified: Secondary | ICD-10-CM | POA: Diagnosis not present

## 2022-09-08 DIAGNOSIS — Z1339 Encounter for screening examination for other mental health and behavioral disorders: Secondary | ICD-10-CM | POA: Diagnosis not present

## 2022-09-09 DIAGNOSIS — M545 Low back pain, unspecified: Secondary | ICD-10-CM | POA: Diagnosis not present

## 2022-09-09 DIAGNOSIS — M6281 Muscle weakness (generalized): Secondary | ICD-10-CM | POA: Diagnosis not present

## 2022-09-09 DIAGNOSIS — M2569 Stiffness of other specified joint, not elsewhere classified: Secondary | ICD-10-CM | POA: Diagnosis not present

## 2022-09-09 DIAGNOSIS — J3089 Other allergic rhinitis: Secondary | ICD-10-CM | POA: Diagnosis not present

## 2022-09-09 DIAGNOSIS — J301 Allergic rhinitis due to pollen: Secondary | ICD-10-CM | POA: Diagnosis not present

## 2022-09-09 DIAGNOSIS — J3081 Allergic rhinitis due to animal (cat) (dog) hair and dander: Secondary | ICD-10-CM | POA: Diagnosis not present

## 2022-09-09 DIAGNOSIS — M48061 Spinal stenosis, lumbar region without neurogenic claudication: Secondary | ICD-10-CM | POA: Diagnosis not present

## 2022-09-10 DIAGNOSIS — M6281 Muscle weakness (generalized): Secondary | ICD-10-CM | POA: Diagnosis not present

## 2022-09-10 DIAGNOSIS — M48061 Spinal stenosis, lumbar region without neurogenic claudication: Secondary | ICD-10-CM | POA: Diagnosis not present

## 2022-09-10 DIAGNOSIS — M2569 Stiffness of other specified joint, not elsewhere classified: Secondary | ICD-10-CM | POA: Diagnosis not present

## 2022-09-10 DIAGNOSIS — M545 Low back pain, unspecified: Secondary | ICD-10-CM | POA: Diagnosis not present

## 2022-09-16 DIAGNOSIS — J301 Allergic rhinitis due to pollen: Secondary | ICD-10-CM | POA: Diagnosis not present

## 2022-09-16 DIAGNOSIS — J3081 Allergic rhinitis due to animal (cat) (dog) hair and dander: Secondary | ICD-10-CM | POA: Diagnosis not present

## 2022-09-16 DIAGNOSIS — J3089 Other allergic rhinitis: Secondary | ICD-10-CM | POA: Diagnosis not present

## 2022-09-18 DIAGNOSIS — M48061 Spinal stenosis, lumbar region without neurogenic claudication: Secondary | ICD-10-CM | POA: Diagnosis not present

## 2022-09-18 DIAGNOSIS — Z6828 Body mass index (BMI) 28.0-28.9, adult: Secondary | ICD-10-CM | POA: Diagnosis not present

## 2022-09-23 DIAGNOSIS — J301 Allergic rhinitis due to pollen: Secondary | ICD-10-CM | POA: Diagnosis not present

## 2022-09-23 DIAGNOSIS — J3081 Allergic rhinitis due to animal (cat) (dog) hair and dander: Secondary | ICD-10-CM | POA: Diagnosis not present

## 2022-09-23 DIAGNOSIS — J3089 Other allergic rhinitis: Secondary | ICD-10-CM | POA: Diagnosis not present

## 2022-09-30 DIAGNOSIS — J3089 Other allergic rhinitis: Secondary | ICD-10-CM | POA: Diagnosis not present

## 2022-09-30 DIAGNOSIS — J301 Allergic rhinitis due to pollen: Secondary | ICD-10-CM | POA: Diagnosis not present

## 2022-10-07 DIAGNOSIS — J3081 Allergic rhinitis due to animal (cat) (dog) hair and dander: Secondary | ICD-10-CM | POA: Diagnosis not present

## 2022-10-07 DIAGNOSIS — J301 Allergic rhinitis due to pollen: Secondary | ICD-10-CM | POA: Diagnosis not present

## 2022-10-07 DIAGNOSIS — J3089 Other allergic rhinitis: Secondary | ICD-10-CM | POA: Diagnosis not present

## 2022-10-14 DIAGNOSIS — J3081 Allergic rhinitis due to animal (cat) (dog) hair and dander: Secondary | ICD-10-CM | POA: Diagnosis not present

## 2022-10-14 DIAGNOSIS — J301 Allergic rhinitis due to pollen: Secondary | ICD-10-CM | POA: Diagnosis not present

## 2022-10-14 DIAGNOSIS — J3089 Other allergic rhinitis: Secondary | ICD-10-CM | POA: Diagnosis not present

## 2022-10-21 DIAGNOSIS — J3089 Other allergic rhinitis: Secondary | ICD-10-CM | POA: Diagnosis not present

## 2022-10-21 DIAGNOSIS — J301 Allergic rhinitis due to pollen: Secondary | ICD-10-CM | POA: Diagnosis not present

## 2022-10-28 DIAGNOSIS — J301 Allergic rhinitis due to pollen: Secondary | ICD-10-CM | POA: Diagnosis not present

## 2022-10-28 DIAGNOSIS — J3089 Other allergic rhinitis: Secondary | ICD-10-CM | POA: Diagnosis not present

## 2022-10-28 DIAGNOSIS — J3081 Allergic rhinitis due to animal (cat) (dog) hair and dander: Secondary | ICD-10-CM | POA: Diagnosis not present

## 2022-11-04 DIAGNOSIS — J3089 Other allergic rhinitis: Secondary | ICD-10-CM | POA: Diagnosis not present

## 2022-11-04 DIAGNOSIS — J301 Allergic rhinitis due to pollen: Secondary | ICD-10-CM | POA: Diagnosis not present

## 2022-11-04 DIAGNOSIS — J3081 Allergic rhinitis due to animal (cat) (dog) hair and dander: Secondary | ICD-10-CM | POA: Diagnosis not present

## 2022-11-11 DIAGNOSIS — J3081 Allergic rhinitis due to animal (cat) (dog) hair and dander: Secondary | ICD-10-CM | POA: Diagnosis not present

## 2022-11-11 DIAGNOSIS — J301 Allergic rhinitis due to pollen: Secondary | ICD-10-CM | POA: Diagnosis not present

## 2022-11-11 DIAGNOSIS — J3089 Other allergic rhinitis: Secondary | ICD-10-CM | POA: Diagnosis not present

## 2022-11-18 DIAGNOSIS — J3081 Allergic rhinitis due to animal (cat) (dog) hair and dander: Secondary | ICD-10-CM | POA: Diagnosis not present

## 2022-11-18 DIAGNOSIS — J3089 Other allergic rhinitis: Secondary | ICD-10-CM | POA: Diagnosis not present

## 2022-11-18 DIAGNOSIS — J301 Allergic rhinitis due to pollen: Secondary | ICD-10-CM | POA: Diagnosis not present

## 2022-11-25 DIAGNOSIS — J3089 Other allergic rhinitis: Secondary | ICD-10-CM | POA: Diagnosis not present

## 2022-11-25 DIAGNOSIS — J3081 Allergic rhinitis due to animal (cat) (dog) hair and dander: Secondary | ICD-10-CM | POA: Diagnosis not present

## 2022-11-25 DIAGNOSIS — J301 Allergic rhinitis due to pollen: Secondary | ICD-10-CM | POA: Diagnosis not present

## 2022-12-02 DIAGNOSIS — J3089 Other allergic rhinitis: Secondary | ICD-10-CM | POA: Diagnosis not present

## 2022-12-02 DIAGNOSIS — J3081 Allergic rhinitis due to animal (cat) (dog) hair and dander: Secondary | ICD-10-CM | POA: Diagnosis not present

## 2022-12-02 DIAGNOSIS — J301 Allergic rhinitis due to pollen: Secondary | ICD-10-CM | POA: Diagnosis not present

## 2022-12-09 DIAGNOSIS — J301 Allergic rhinitis due to pollen: Secondary | ICD-10-CM | POA: Diagnosis not present

## 2022-12-09 DIAGNOSIS — J3089 Other allergic rhinitis: Secondary | ICD-10-CM | POA: Diagnosis not present

## 2022-12-09 DIAGNOSIS — J3081 Allergic rhinitis due to animal (cat) (dog) hair and dander: Secondary | ICD-10-CM | POA: Diagnosis not present

## 2022-12-16 DIAGNOSIS — J3081 Allergic rhinitis due to animal (cat) (dog) hair and dander: Secondary | ICD-10-CM | POA: Diagnosis not present

## 2022-12-16 DIAGNOSIS — J3089 Other allergic rhinitis: Secondary | ICD-10-CM | POA: Diagnosis not present

## 2022-12-16 DIAGNOSIS — J301 Allergic rhinitis due to pollen: Secondary | ICD-10-CM | POA: Diagnosis not present

## 2022-12-22 DIAGNOSIS — I10 Essential (primary) hypertension: Secondary | ICD-10-CM | POA: Diagnosis not present

## 2022-12-22 DIAGNOSIS — E785 Hyperlipidemia, unspecified: Secondary | ICD-10-CM | POA: Diagnosis not present

## 2022-12-22 DIAGNOSIS — E1169 Type 2 diabetes mellitus with other specified complication: Secondary | ICD-10-CM | POA: Diagnosis not present

## 2022-12-22 DIAGNOSIS — E663 Overweight: Secondary | ICD-10-CM | POA: Diagnosis not present

## 2022-12-23 DIAGNOSIS — J3089 Other allergic rhinitis: Secondary | ICD-10-CM | POA: Diagnosis not present

## 2022-12-23 DIAGNOSIS — J301 Allergic rhinitis due to pollen: Secondary | ICD-10-CM | POA: Diagnosis not present

## 2022-12-23 DIAGNOSIS — J3081 Allergic rhinitis due to animal (cat) (dog) hair and dander: Secondary | ICD-10-CM | POA: Diagnosis not present

## 2022-12-30 DIAGNOSIS — J3089 Other allergic rhinitis: Secondary | ICD-10-CM | POA: Diagnosis not present

## 2022-12-30 DIAGNOSIS — J301 Allergic rhinitis due to pollen: Secondary | ICD-10-CM | POA: Diagnosis not present

## 2022-12-31 DIAGNOSIS — J3089 Other allergic rhinitis: Secondary | ICD-10-CM | POA: Diagnosis not present

## 2022-12-31 DIAGNOSIS — J301 Allergic rhinitis due to pollen: Secondary | ICD-10-CM | POA: Diagnosis not present

## 2023-01-06 DIAGNOSIS — J3081 Allergic rhinitis due to animal (cat) (dog) hair and dander: Secondary | ICD-10-CM | POA: Diagnosis not present

## 2023-01-06 DIAGNOSIS — J301 Allergic rhinitis due to pollen: Secondary | ICD-10-CM | POA: Diagnosis not present

## 2023-01-06 DIAGNOSIS — J3089 Other allergic rhinitis: Secondary | ICD-10-CM | POA: Diagnosis not present

## 2023-01-07 DIAGNOSIS — H5203 Hypermetropia, bilateral: Secondary | ICD-10-CM | POA: Diagnosis not present

## 2023-01-07 DIAGNOSIS — E113291 Type 2 diabetes mellitus with mild nonproliferative diabetic retinopathy without macular edema, right eye: Secondary | ICD-10-CM | POA: Diagnosis not present

## 2023-01-07 DIAGNOSIS — H40033 Anatomical narrow angle, bilateral: Secondary | ICD-10-CM | POA: Diagnosis not present

## 2023-01-07 DIAGNOSIS — H524 Presbyopia: Secondary | ICD-10-CM | POA: Diagnosis not present

## 2023-01-07 DIAGNOSIS — H353122 Nonexudative age-related macular degeneration, left eye, intermediate dry stage: Secondary | ICD-10-CM | POA: Diagnosis not present

## 2023-01-07 DIAGNOSIS — H2513 Age-related nuclear cataract, bilateral: Secondary | ICD-10-CM | POA: Diagnosis not present

## 2023-01-13 DIAGNOSIS — J3081 Allergic rhinitis due to animal (cat) (dog) hair and dander: Secondary | ICD-10-CM | POA: Diagnosis not present

## 2023-01-13 DIAGNOSIS — J3089 Other allergic rhinitis: Secondary | ICD-10-CM | POA: Diagnosis not present

## 2023-01-13 DIAGNOSIS — J301 Allergic rhinitis due to pollen: Secondary | ICD-10-CM | POA: Diagnosis not present

## 2023-01-20 DIAGNOSIS — J3081 Allergic rhinitis due to animal (cat) (dog) hair and dander: Secondary | ICD-10-CM | POA: Diagnosis not present

## 2023-01-20 DIAGNOSIS — J3089 Other allergic rhinitis: Secondary | ICD-10-CM | POA: Diagnosis not present

## 2023-01-20 DIAGNOSIS — J301 Allergic rhinitis due to pollen: Secondary | ICD-10-CM | POA: Diagnosis not present

## 2023-01-23 DIAGNOSIS — Z01419 Encounter for gynecological examination (general) (routine) without abnormal findings: Secondary | ICD-10-CM | POA: Diagnosis not present

## 2023-01-23 DIAGNOSIS — Z6828 Body mass index (BMI) 28.0-28.9, adult: Secondary | ICD-10-CM | POA: Diagnosis not present

## 2023-01-23 DIAGNOSIS — Z124 Encounter for screening for malignant neoplasm of cervix: Secondary | ICD-10-CM | POA: Diagnosis not present

## 2023-01-23 DIAGNOSIS — Z01411 Encounter for gynecological examination (general) (routine) with abnormal findings: Secondary | ICD-10-CM | POA: Diagnosis not present

## 2023-01-27 DIAGNOSIS — J3089 Other allergic rhinitis: Secondary | ICD-10-CM | POA: Diagnosis not present

## 2023-01-27 DIAGNOSIS — J301 Allergic rhinitis due to pollen: Secondary | ICD-10-CM | POA: Diagnosis not present

## 2023-01-27 DIAGNOSIS — J3081 Allergic rhinitis due to animal (cat) (dog) hair and dander: Secondary | ICD-10-CM | POA: Diagnosis not present

## 2023-02-03 DIAGNOSIS — J3081 Allergic rhinitis due to animal (cat) (dog) hair and dander: Secondary | ICD-10-CM | POA: Diagnosis not present

## 2023-02-03 DIAGNOSIS — J3089 Other allergic rhinitis: Secondary | ICD-10-CM | POA: Diagnosis not present

## 2023-02-03 DIAGNOSIS — J301 Allergic rhinitis due to pollen: Secondary | ICD-10-CM | POA: Diagnosis not present

## 2023-02-09 DIAGNOSIS — J3081 Allergic rhinitis due to animal (cat) (dog) hair and dander: Secondary | ICD-10-CM | POA: Diagnosis not present

## 2023-02-09 DIAGNOSIS — J301 Allergic rhinitis due to pollen: Secondary | ICD-10-CM | POA: Diagnosis not present

## 2023-02-09 DIAGNOSIS — J3089 Other allergic rhinitis: Secondary | ICD-10-CM | POA: Diagnosis not present

## 2023-02-17 DIAGNOSIS — J3089 Other allergic rhinitis: Secondary | ICD-10-CM | POA: Diagnosis not present

## 2023-02-17 DIAGNOSIS — J301 Allergic rhinitis due to pollen: Secondary | ICD-10-CM | POA: Diagnosis not present

## 2023-02-17 DIAGNOSIS — J3081 Allergic rhinitis due to animal (cat) (dog) hair and dander: Secondary | ICD-10-CM | POA: Diagnosis not present

## 2023-02-24 DIAGNOSIS — J3081 Allergic rhinitis due to animal (cat) (dog) hair and dander: Secondary | ICD-10-CM | POA: Diagnosis not present

## 2023-02-24 DIAGNOSIS — J3089 Other allergic rhinitis: Secondary | ICD-10-CM | POA: Diagnosis not present

## 2023-02-24 DIAGNOSIS — J301 Allergic rhinitis due to pollen: Secondary | ICD-10-CM | POA: Diagnosis not present

## 2023-03-03 DIAGNOSIS — J301 Allergic rhinitis due to pollen: Secondary | ICD-10-CM | POA: Diagnosis not present

## 2023-03-03 DIAGNOSIS — J3089 Other allergic rhinitis: Secondary | ICD-10-CM | POA: Diagnosis not present

## 2023-03-03 DIAGNOSIS — J3081 Allergic rhinitis due to animal (cat) (dog) hair and dander: Secondary | ICD-10-CM | POA: Diagnosis not present

## 2023-03-10 DIAGNOSIS — J3089 Other allergic rhinitis: Secondary | ICD-10-CM | POA: Diagnosis not present

## 2023-03-10 DIAGNOSIS — J3081 Allergic rhinitis due to animal (cat) (dog) hair and dander: Secondary | ICD-10-CM | POA: Diagnosis not present

## 2023-03-10 DIAGNOSIS — J301 Allergic rhinitis due to pollen: Secondary | ICD-10-CM | POA: Diagnosis not present

## 2023-03-17 DIAGNOSIS — J3081 Allergic rhinitis due to animal (cat) (dog) hair and dander: Secondary | ICD-10-CM | POA: Diagnosis not present

## 2023-03-17 DIAGNOSIS — J3089 Other allergic rhinitis: Secondary | ICD-10-CM | POA: Diagnosis not present

## 2023-03-17 DIAGNOSIS — J301 Allergic rhinitis due to pollen: Secondary | ICD-10-CM | POA: Diagnosis not present

## 2023-03-19 ENCOUNTER — Encounter: Payer: Self-pay | Admitting: Internal Medicine

## 2023-03-24 DIAGNOSIS — J301 Allergic rhinitis due to pollen: Secondary | ICD-10-CM | POA: Diagnosis not present

## 2023-03-24 DIAGNOSIS — J3089 Other allergic rhinitis: Secondary | ICD-10-CM | POA: Diagnosis not present

## 2023-03-31 DIAGNOSIS — J301 Allergic rhinitis due to pollen: Secondary | ICD-10-CM | POA: Diagnosis not present

## 2023-03-31 DIAGNOSIS — J3089 Other allergic rhinitis: Secondary | ICD-10-CM | POA: Diagnosis not present

## 2023-03-31 DIAGNOSIS — J3081 Allergic rhinitis due to animal (cat) (dog) hair and dander: Secondary | ICD-10-CM | POA: Diagnosis not present

## 2023-04-07 DIAGNOSIS — J301 Allergic rhinitis due to pollen: Secondary | ICD-10-CM | POA: Diagnosis not present

## 2023-04-07 DIAGNOSIS — J3089 Other allergic rhinitis: Secondary | ICD-10-CM | POA: Diagnosis not present

## 2023-04-07 DIAGNOSIS — J3081 Allergic rhinitis due to animal (cat) (dog) hair and dander: Secondary | ICD-10-CM | POA: Diagnosis not present

## 2023-04-14 DIAGNOSIS — J3081 Allergic rhinitis due to animal (cat) (dog) hair and dander: Secondary | ICD-10-CM | POA: Diagnosis not present

## 2023-04-14 DIAGNOSIS — J3089 Other allergic rhinitis: Secondary | ICD-10-CM | POA: Diagnosis not present

## 2023-04-14 DIAGNOSIS — J301 Allergic rhinitis due to pollen: Secondary | ICD-10-CM | POA: Diagnosis not present

## 2023-04-21 DIAGNOSIS — J301 Allergic rhinitis due to pollen: Secondary | ICD-10-CM | POA: Diagnosis not present

## 2023-04-21 DIAGNOSIS — J3089 Other allergic rhinitis: Secondary | ICD-10-CM | POA: Diagnosis not present

## 2023-04-27 DIAGNOSIS — E663 Overweight: Secondary | ICD-10-CM | POA: Diagnosis not present

## 2023-04-27 DIAGNOSIS — E1169 Type 2 diabetes mellitus with other specified complication: Secondary | ICD-10-CM | POA: Diagnosis not present

## 2023-04-27 DIAGNOSIS — I10 Essential (primary) hypertension: Secondary | ICD-10-CM | POA: Diagnosis not present

## 2023-04-27 DIAGNOSIS — M545 Low back pain, unspecified: Secondary | ICD-10-CM | POA: Diagnosis not present

## 2023-04-28 DIAGNOSIS — J3081 Allergic rhinitis due to animal (cat) (dog) hair and dander: Secondary | ICD-10-CM | POA: Diagnosis not present

## 2023-04-28 DIAGNOSIS — J301 Allergic rhinitis due to pollen: Secondary | ICD-10-CM | POA: Diagnosis not present

## 2023-04-28 DIAGNOSIS — J3089 Other allergic rhinitis: Secondary | ICD-10-CM | POA: Diagnosis not present

## 2023-05-05 DIAGNOSIS — J301 Allergic rhinitis due to pollen: Secondary | ICD-10-CM | POA: Diagnosis not present

## 2023-05-05 DIAGNOSIS — J3089 Other allergic rhinitis: Secondary | ICD-10-CM | POA: Diagnosis not present

## 2023-05-05 DIAGNOSIS — J3081 Allergic rhinitis due to animal (cat) (dog) hair and dander: Secondary | ICD-10-CM | POA: Diagnosis not present

## 2023-05-07 DIAGNOSIS — Z1211 Encounter for screening for malignant neoplasm of colon: Secondary | ICD-10-CM | POA: Diagnosis not present

## 2023-05-12 DIAGNOSIS — J3081 Allergic rhinitis due to animal (cat) (dog) hair and dander: Secondary | ICD-10-CM | POA: Diagnosis not present

## 2023-05-12 DIAGNOSIS — J301 Allergic rhinitis due to pollen: Secondary | ICD-10-CM | POA: Diagnosis not present

## 2023-05-12 DIAGNOSIS — J3089 Other allergic rhinitis: Secondary | ICD-10-CM | POA: Diagnosis not present

## 2023-05-19 DIAGNOSIS — J3089 Other allergic rhinitis: Secondary | ICD-10-CM | POA: Diagnosis not present

## 2023-05-19 DIAGNOSIS — J301 Allergic rhinitis due to pollen: Secondary | ICD-10-CM | POA: Diagnosis not present

## 2023-05-19 DIAGNOSIS — J3081 Allergic rhinitis due to animal (cat) (dog) hair and dander: Secondary | ICD-10-CM | POA: Diagnosis not present

## 2023-05-26 DIAGNOSIS — J301 Allergic rhinitis due to pollen: Secondary | ICD-10-CM | POA: Diagnosis not present

## 2023-05-26 DIAGNOSIS — J3089 Other allergic rhinitis: Secondary | ICD-10-CM | POA: Diagnosis not present

## 2023-06-02 DIAGNOSIS — J3081 Allergic rhinitis due to animal (cat) (dog) hair and dander: Secondary | ICD-10-CM | POA: Diagnosis not present

## 2023-06-02 DIAGNOSIS — J3089 Other allergic rhinitis: Secondary | ICD-10-CM | POA: Diagnosis not present

## 2023-06-02 DIAGNOSIS — J301 Allergic rhinitis due to pollen: Secondary | ICD-10-CM | POA: Diagnosis not present

## 2023-06-05 DIAGNOSIS — T63441D Toxic effect of venom of bees, accidental (unintentional), subsequent encounter: Secondary | ICD-10-CM | POA: Diagnosis not present

## 2023-06-05 DIAGNOSIS — J3081 Allergic rhinitis due to animal (cat) (dog) hair and dander: Secondary | ICD-10-CM | POA: Diagnosis not present

## 2023-06-05 DIAGNOSIS — J301 Allergic rhinitis due to pollen: Secondary | ICD-10-CM | POA: Diagnosis not present

## 2023-06-05 DIAGNOSIS — J3089 Other allergic rhinitis: Secondary | ICD-10-CM | POA: Diagnosis not present

## 2023-06-09 DIAGNOSIS — J3089 Other allergic rhinitis: Secondary | ICD-10-CM | POA: Diagnosis not present

## 2023-06-09 DIAGNOSIS — J301 Allergic rhinitis due to pollen: Secondary | ICD-10-CM | POA: Diagnosis not present

## 2023-06-16 DIAGNOSIS — J3089 Other allergic rhinitis: Secondary | ICD-10-CM | POA: Diagnosis not present

## 2023-06-16 DIAGNOSIS — J3081 Allergic rhinitis due to animal (cat) (dog) hair and dander: Secondary | ICD-10-CM | POA: Diagnosis not present

## 2023-06-16 DIAGNOSIS — J301 Allergic rhinitis due to pollen: Secondary | ICD-10-CM | POA: Diagnosis not present

## 2023-06-23 DIAGNOSIS — J3081 Allergic rhinitis due to animal (cat) (dog) hair and dander: Secondary | ICD-10-CM | POA: Diagnosis not present

## 2023-06-23 DIAGNOSIS — J3089 Other allergic rhinitis: Secondary | ICD-10-CM | POA: Diagnosis not present

## 2023-06-23 DIAGNOSIS — J301 Allergic rhinitis due to pollen: Secondary | ICD-10-CM | POA: Diagnosis not present

## 2023-06-30 DIAGNOSIS — J3081 Allergic rhinitis due to animal (cat) (dog) hair and dander: Secondary | ICD-10-CM | POA: Diagnosis not present

## 2023-06-30 DIAGNOSIS — J301 Allergic rhinitis due to pollen: Secondary | ICD-10-CM | POA: Diagnosis not present

## 2023-06-30 DIAGNOSIS — J3089 Other allergic rhinitis: Secondary | ICD-10-CM | POA: Diagnosis not present

## 2023-07-07 DIAGNOSIS — J3081 Allergic rhinitis due to animal (cat) (dog) hair and dander: Secondary | ICD-10-CM | POA: Diagnosis not present

## 2023-07-07 DIAGNOSIS — J3089 Other allergic rhinitis: Secondary | ICD-10-CM | POA: Diagnosis not present

## 2023-07-07 DIAGNOSIS — J301 Allergic rhinitis due to pollen: Secondary | ICD-10-CM | POA: Diagnosis not present

## 2023-07-14 DIAGNOSIS — J3089 Other allergic rhinitis: Secondary | ICD-10-CM | POA: Diagnosis not present

## 2023-07-14 DIAGNOSIS — J3081 Allergic rhinitis due to animal (cat) (dog) hair and dander: Secondary | ICD-10-CM | POA: Diagnosis not present

## 2023-07-14 DIAGNOSIS — J301 Allergic rhinitis due to pollen: Secondary | ICD-10-CM | POA: Diagnosis not present

## 2023-07-21 DIAGNOSIS — J3089 Other allergic rhinitis: Secondary | ICD-10-CM | POA: Diagnosis not present

## 2023-07-21 DIAGNOSIS — J301 Allergic rhinitis due to pollen: Secondary | ICD-10-CM | POA: Diagnosis not present

## 2023-07-21 DIAGNOSIS — J3081 Allergic rhinitis due to animal (cat) (dog) hair and dander: Secondary | ICD-10-CM | POA: Diagnosis not present

## 2023-07-28 DIAGNOSIS — J3081 Allergic rhinitis due to animal (cat) (dog) hair and dander: Secondary | ICD-10-CM | POA: Diagnosis not present

## 2023-07-28 DIAGNOSIS — J301 Allergic rhinitis due to pollen: Secondary | ICD-10-CM | POA: Diagnosis not present

## 2023-07-28 DIAGNOSIS — J3089 Other allergic rhinitis: Secondary | ICD-10-CM | POA: Diagnosis not present

## 2023-08-04 DIAGNOSIS — J3089 Other allergic rhinitis: Secondary | ICD-10-CM | POA: Diagnosis not present

## 2023-08-04 DIAGNOSIS — J301 Allergic rhinitis due to pollen: Secondary | ICD-10-CM | POA: Diagnosis not present

## 2023-08-11 DIAGNOSIS — J3089 Other allergic rhinitis: Secondary | ICD-10-CM | POA: Diagnosis not present

## 2023-08-11 DIAGNOSIS — J301 Allergic rhinitis due to pollen: Secondary | ICD-10-CM | POA: Diagnosis not present

## 2023-08-11 DIAGNOSIS — J3081 Allergic rhinitis due to animal (cat) (dog) hair and dander: Secondary | ICD-10-CM | POA: Diagnosis not present

## 2023-08-18 DIAGNOSIS — J3089 Other allergic rhinitis: Secondary | ICD-10-CM | POA: Diagnosis not present

## 2023-08-18 DIAGNOSIS — J3081 Allergic rhinitis due to animal (cat) (dog) hair and dander: Secondary | ICD-10-CM | POA: Diagnosis not present

## 2023-08-18 DIAGNOSIS — J301 Allergic rhinitis due to pollen: Secondary | ICD-10-CM | POA: Diagnosis not present

## 2023-08-25 DIAGNOSIS — J3081 Allergic rhinitis due to animal (cat) (dog) hair and dander: Secondary | ICD-10-CM | POA: Diagnosis not present

## 2023-08-25 DIAGNOSIS — J3089 Other allergic rhinitis: Secondary | ICD-10-CM | POA: Diagnosis not present

## 2023-08-25 DIAGNOSIS — J301 Allergic rhinitis due to pollen: Secondary | ICD-10-CM | POA: Diagnosis not present

## 2023-08-26 DIAGNOSIS — Z1231 Encounter for screening mammogram for malignant neoplasm of breast: Secondary | ICD-10-CM | POA: Diagnosis not present

## 2023-09-01 DIAGNOSIS — J3081 Allergic rhinitis due to animal (cat) (dog) hair and dander: Secondary | ICD-10-CM | POA: Diagnosis not present

## 2023-09-01 DIAGNOSIS — J301 Allergic rhinitis due to pollen: Secondary | ICD-10-CM | POA: Diagnosis not present

## 2023-09-01 DIAGNOSIS — J3089 Other allergic rhinitis: Secondary | ICD-10-CM | POA: Diagnosis not present

## 2023-09-08 DIAGNOSIS — J3089 Other allergic rhinitis: Secondary | ICD-10-CM | POA: Diagnosis not present

## 2023-09-08 DIAGNOSIS — J3081 Allergic rhinitis due to animal (cat) (dog) hair and dander: Secondary | ICD-10-CM | POA: Diagnosis not present

## 2023-09-08 DIAGNOSIS — J301 Allergic rhinitis due to pollen: Secondary | ICD-10-CM | POA: Diagnosis not present

## 2023-09-14 DIAGNOSIS — J301 Allergic rhinitis due to pollen: Secondary | ICD-10-CM | POA: Diagnosis not present

## 2023-09-14 DIAGNOSIS — J3081 Allergic rhinitis due to animal (cat) (dog) hair and dander: Secondary | ICD-10-CM | POA: Diagnosis not present

## 2023-09-14 DIAGNOSIS — J3089 Other allergic rhinitis: Secondary | ICD-10-CM | POA: Diagnosis not present

## 2023-09-21 DIAGNOSIS — J3081 Allergic rhinitis due to animal (cat) (dog) hair and dander: Secondary | ICD-10-CM | POA: Diagnosis not present

## 2023-09-21 DIAGNOSIS — J3089 Other allergic rhinitis: Secondary | ICD-10-CM | POA: Diagnosis not present

## 2023-09-21 DIAGNOSIS — J301 Allergic rhinitis due to pollen: Secondary | ICD-10-CM | POA: Diagnosis not present

## 2023-09-29 DIAGNOSIS — J3089 Other allergic rhinitis: Secondary | ICD-10-CM | POA: Diagnosis not present

## 2023-09-29 DIAGNOSIS — J301 Allergic rhinitis due to pollen: Secondary | ICD-10-CM | POA: Diagnosis not present

## 2023-10-01 DIAGNOSIS — E785 Hyperlipidemia, unspecified: Secondary | ICD-10-CM | POA: Diagnosis not present

## 2023-10-01 DIAGNOSIS — I10 Essential (primary) hypertension: Secondary | ICD-10-CM | POA: Diagnosis not present

## 2023-10-01 DIAGNOSIS — E1169 Type 2 diabetes mellitus with other specified complication: Secondary | ICD-10-CM | POA: Diagnosis not present

## 2023-10-06 DIAGNOSIS — J301 Allergic rhinitis due to pollen: Secondary | ICD-10-CM | POA: Diagnosis not present

## 2023-10-06 DIAGNOSIS — J3089 Other allergic rhinitis: Secondary | ICD-10-CM | POA: Diagnosis not present

## 2023-10-06 DIAGNOSIS — J3081 Allergic rhinitis due to animal (cat) (dog) hair and dander: Secondary | ICD-10-CM | POA: Diagnosis not present

## 2023-10-12 DIAGNOSIS — Z1339 Encounter for screening examination for other mental health and behavioral disorders: Secondary | ICD-10-CM | POA: Diagnosis not present

## 2023-10-12 DIAGNOSIS — I10 Essential (primary) hypertension: Secondary | ICD-10-CM | POA: Diagnosis not present

## 2023-10-12 DIAGNOSIS — E785 Hyperlipidemia, unspecified: Secondary | ICD-10-CM | POA: Diagnosis not present

## 2023-10-12 DIAGNOSIS — Z1331 Encounter for screening for depression: Secondary | ICD-10-CM | POA: Diagnosis not present

## 2023-10-12 DIAGNOSIS — E663 Overweight: Secondary | ICD-10-CM | POA: Diagnosis not present

## 2023-10-12 DIAGNOSIS — E1169 Type 2 diabetes mellitus with other specified complication: Secondary | ICD-10-CM | POA: Diagnosis not present

## 2023-10-12 DIAGNOSIS — M1990 Unspecified osteoarthritis, unspecified site: Secondary | ICD-10-CM | POA: Diagnosis not present

## 2023-10-12 DIAGNOSIS — Z0000 Encounter for general adult medical examination without abnormal findings: Secondary | ICD-10-CM | POA: Diagnosis not present

## 2023-10-12 DIAGNOSIS — M810 Age-related osteoporosis without current pathological fracture: Secondary | ICD-10-CM | POA: Diagnosis not present

## 2023-10-12 DIAGNOSIS — N390 Urinary tract infection, site not specified: Secondary | ICD-10-CM | POA: Diagnosis not present

## 2023-10-13 DIAGNOSIS — J301 Allergic rhinitis due to pollen: Secondary | ICD-10-CM | POA: Diagnosis not present

## 2023-10-13 DIAGNOSIS — J3089 Other allergic rhinitis: Secondary | ICD-10-CM | POA: Diagnosis not present

## 2023-10-20 DIAGNOSIS — J3089 Other allergic rhinitis: Secondary | ICD-10-CM | POA: Diagnosis not present

## 2023-10-20 DIAGNOSIS — J301 Allergic rhinitis due to pollen: Secondary | ICD-10-CM | POA: Diagnosis not present

## 2023-10-23 DIAGNOSIS — J3089 Other allergic rhinitis: Secondary | ICD-10-CM | POA: Diagnosis not present

## 2023-10-23 DIAGNOSIS — J301 Allergic rhinitis due to pollen: Secondary | ICD-10-CM | POA: Diagnosis not present

## 2023-10-27 DIAGNOSIS — J301 Allergic rhinitis due to pollen: Secondary | ICD-10-CM | POA: Diagnosis not present

## 2023-10-27 DIAGNOSIS — J3089 Other allergic rhinitis: Secondary | ICD-10-CM | POA: Diagnosis not present

## 2023-11-03 DIAGNOSIS — J3089 Other allergic rhinitis: Secondary | ICD-10-CM | POA: Diagnosis not present

## 2023-11-03 DIAGNOSIS — J301 Allergic rhinitis due to pollen: Secondary | ICD-10-CM | POA: Diagnosis not present

## 2023-11-03 DIAGNOSIS — J3081 Allergic rhinitis due to animal (cat) (dog) hair and dander: Secondary | ICD-10-CM | POA: Diagnosis not present

## 2023-11-10 DIAGNOSIS — J301 Allergic rhinitis due to pollen: Secondary | ICD-10-CM | POA: Diagnosis not present

## 2023-11-10 DIAGNOSIS — J3089 Other allergic rhinitis: Secondary | ICD-10-CM | POA: Diagnosis not present

## 2023-11-17 DIAGNOSIS — J3089 Other allergic rhinitis: Secondary | ICD-10-CM | POA: Diagnosis not present

## 2023-11-17 DIAGNOSIS — J3081 Allergic rhinitis due to animal (cat) (dog) hair and dander: Secondary | ICD-10-CM | POA: Diagnosis not present

## 2023-11-17 DIAGNOSIS — J301 Allergic rhinitis due to pollen: Secondary | ICD-10-CM | POA: Diagnosis not present

## 2023-11-24 DIAGNOSIS — J3081 Allergic rhinitis due to animal (cat) (dog) hair and dander: Secondary | ICD-10-CM | POA: Diagnosis not present

## 2023-11-24 DIAGNOSIS — J3089 Other allergic rhinitis: Secondary | ICD-10-CM | POA: Diagnosis not present

## 2023-11-24 DIAGNOSIS — J301 Allergic rhinitis due to pollen: Secondary | ICD-10-CM | POA: Diagnosis not present

## 2023-12-01 DIAGNOSIS — J301 Allergic rhinitis due to pollen: Secondary | ICD-10-CM | POA: Diagnosis not present

## 2023-12-01 DIAGNOSIS — J3081 Allergic rhinitis due to animal (cat) (dog) hair and dander: Secondary | ICD-10-CM | POA: Diagnosis not present

## 2023-12-01 DIAGNOSIS — J3089 Other allergic rhinitis: Secondary | ICD-10-CM | POA: Diagnosis not present

## 2023-12-08 DIAGNOSIS — J3089 Other allergic rhinitis: Secondary | ICD-10-CM | POA: Diagnosis not present

## 2023-12-08 DIAGNOSIS — J301 Allergic rhinitis due to pollen: Secondary | ICD-10-CM | POA: Diagnosis not present

## 2023-12-08 DIAGNOSIS — J3081 Allergic rhinitis due to animal (cat) (dog) hair and dander: Secondary | ICD-10-CM | POA: Diagnosis not present

## 2023-12-10 DIAGNOSIS — J029 Acute pharyngitis, unspecified: Secondary | ICD-10-CM | POA: Diagnosis not present

## 2023-12-10 DIAGNOSIS — J3089 Other allergic rhinitis: Secondary | ICD-10-CM | POA: Diagnosis not present

## 2023-12-10 DIAGNOSIS — J301 Allergic rhinitis due to pollen: Secondary | ICD-10-CM | POA: Diagnosis not present

## 2023-12-10 DIAGNOSIS — J3081 Allergic rhinitis due to animal (cat) (dog) hair and dander: Secondary | ICD-10-CM | POA: Diagnosis not present

## 2023-12-11 DIAGNOSIS — R5383 Other fatigue: Secondary | ICD-10-CM | POA: Diagnosis not present

## 2023-12-11 DIAGNOSIS — J029 Acute pharyngitis, unspecified: Secondary | ICD-10-CM | POA: Diagnosis not present

## 2023-12-11 DIAGNOSIS — R0981 Nasal congestion: Secondary | ICD-10-CM | POA: Diagnosis not present

## 2023-12-11 DIAGNOSIS — Z1152 Encounter for screening for COVID-19: Secondary | ICD-10-CM | POA: Diagnosis not present

## 2023-12-11 DIAGNOSIS — E1169 Type 2 diabetes mellitus with other specified complication: Secondary | ICD-10-CM | POA: Diagnosis not present

## 2023-12-11 DIAGNOSIS — H938X3 Other specified disorders of ear, bilateral: Secondary | ICD-10-CM | POA: Diagnosis not present

## 2023-12-11 DIAGNOSIS — I10 Essential (primary) hypertension: Secondary | ICD-10-CM | POA: Diagnosis not present

## 2023-12-11 DIAGNOSIS — J020 Streptococcal pharyngitis: Secondary | ICD-10-CM | POA: Diagnosis not present

## 2023-12-15 DIAGNOSIS — J3089 Other allergic rhinitis: Secondary | ICD-10-CM | POA: Diagnosis not present

## 2023-12-15 DIAGNOSIS — J301 Allergic rhinitis due to pollen: Secondary | ICD-10-CM | POA: Diagnosis not present

## 2023-12-22 DIAGNOSIS — J301 Allergic rhinitis due to pollen: Secondary | ICD-10-CM | POA: Diagnosis not present

## 2023-12-22 DIAGNOSIS — J3089 Other allergic rhinitis: Secondary | ICD-10-CM | POA: Diagnosis not present

## 2023-12-29 DIAGNOSIS — J301 Allergic rhinitis due to pollen: Secondary | ICD-10-CM | POA: Diagnosis not present

## 2023-12-29 DIAGNOSIS — J3081 Allergic rhinitis due to animal (cat) (dog) hair and dander: Secondary | ICD-10-CM | POA: Diagnosis not present

## 2023-12-29 DIAGNOSIS — J3089 Other allergic rhinitis: Secondary | ICD-10-CM | POA: Diagnosis not present

## 2024-01-05 DIAGNOSIS — J3089 Other allergic rhinitis: Secondary | ICD-10-CM | POA: Diagnosis not present

## 2024-01-05 DIAGNOSIS — J301 Allergic rhinitis due to pollen: Secondary | ICD-10-CM | POA: Diagnosis not present

## 2024-01-12 DIAGNOSIS — J301 Allergic rhinitis due to pollen: Secondary | ICD-10-CM | POA: Diagnosis not present

## 2024-01-12 DIAGNOSIS — J3089 Other allergic rhinitis: Secondary | ICD-10-CM | POA: Diagnosis not present

## 2024-01-13 DIAGNOSIS — I10 Essential (primary) hypertension: Secondary | ICD-10-CM | POA: Diagnosis not present

## 2024-01-13 DIAGNOSIS — M1990 Unspecified osteoarthritis, unspecified site: Secondary | ICD-10-CM | POA: Diagnosis not present

## 2024-01-13 DIAGNOSIS — M810 Age-related osteoporosis without current pathological fracture: Secondary | ICD-10-CM | POA: Diagnosis not present

## 2024-01-13 DIAGNOSIS — E1169 Type 2 diabetes mellitus with other specified complication: Secondary | ICD-10-CM | POA: Diagnosis not present

## 2024-01-13 DIAGNOSIS — E785 Hyperlipidemia, unspecified: Secondary | ICD-10-CM | POA: Diagnosis not present

## 2024-01-19 DIAGNOSIS — J3089 Other allergic rhinitis: Secondary | ICD-10-CM | POA: Diagnosis not present

## 2024-01-19 DIAGNOSIS — J3081 Allergic rhinitis due to animal (cat) (dog) hair and dander: Secondary | ICD-10-CM | POA: Diagnosis not present

## 2024-01-19 DIAGNOSIS — J301 Allergic rhinitis due to pollen: Secondary | ICD-10-CM | POA: Diagnosis not present

## 2024-01-26 DIAGNOSIS — J301 Allergic rhinitis due to pollen: Secondary | ICD-10-CM | POA: Diagnosis not present

## 2024-01-26 DIAGNOSIS — J3089 Other allergic rhinitis: Secondary | ICD-10-CM | POA: Diagnosis not present

## 2024-01-27 DIAGNOSIS — H40033 Anatomical narrow angle, bilateral: Secondary | ICD-10-CM | POA: Diagnosis not present

## 2024-01-27 DIAGNOSIS — H25013 Cortical age-related cataract, bilateral: Secondary | ICD-10-CM | POA: Diagnosis not present

## 2024-01-27 DIAGNOSIS — E113291 Type 2 diabetes mellitus with mild nonproliferative diabetic retinopathy without macular edema, right eye: Secondary | ICD-10-CM | POA: Diagnosis not present

## 2024-01-27 DIAGNOSIS — H5203 Hypermetropia, bilateral: Secondary | ICD-10-CM | POA: Diagnosis not present

## 2024-01-27 DIAGNOSIS — H2513 Age-related nuclear cataract, bilateral: Secondary | ICD-10-CM | POA: Diagnosis not present

## 2024-02-02 DIAGNOSIS — J3089 Other allergic rhinitis: Secondary | ICD-10-CM | POA: Diagnosis not present

## 2024-02-02 DIAGNOSIS — J301 Allergic rhinitis due to pollen: Secondary | ICD-10-CM | POA: Diagnosis not present

## 2024-02-03 DIAGNOSIS — Z01419 Encounter for gynecological examination (general) (routine) without abnormal findings: Secondary | ICD-10-CM | POA: Diagnosis not present

## 2024-02-03 DIAGNOSIS — Z1331 Encounter for screening for depression: Secondary | ICD-10-CM | POA: Diagnosis not present

## 2024-02-09 DIAGNOSIS — J301 Allergic rhinitis due to pollen: Secondary | ICD-10-CM | POA: Diagnosis not present

## 2024-02-09 DIAGNOSIS — J3089 Other allergic rhinitis: Secondary | ICD-10-CM | POA: Diagnosis not present

## 2024-02-09 DIAGNOSIS — J3081 Allergic rhinitis due to animal (cat) (dog) hair and dander: Secondary | ICD-10-CM | POA: Diagnosis not present

## 2024-02-16 DIAGNOSIS — J3089 Other allergic rhinitis: Secondary | ICD-10-CM | POA: Diagnosis not present

## 2024-02-16 DIAGNOSIS — J301 Allergic rhinitis due to pollen: Secondary | ICD-10-CM | POA: Diagnosis not present

## 2024-02-16 DIAGNOSIS — J3081 Allergic rhinitis due to animal (cat) (dog) hair and dander: Secondary | ICD-10-CM | POA: Diagnosis not present

## 2024-02-22 DIAGNOSIS — H2511 Age-related nuclear cataract, right eye: Secondary | ICD-10-CM | POA: Diagnosis not present

## 2024-02-22 DIAGNOSIS — H25812 Combined forms of age-related cataract, left eye: Secondary | ICD-10-CM | POA: Diagnosis not present

## 2024-02-22 DIAGNOSIS — H268 Other specified cataract: Secondary | ICD-10-CM | POA: Diagnosis not present

## 2024-02-22 DIAGNOSIS — Z961 Presence of intraocular lens: Secondary | ICD-10-CM | POA: Diagnosis not present

## 2024-02-22 DIAGNOSIS — H25011 Cortical age-related cataract, right eye: Secondary | ICD-10-CM | POA: Diagnosis not present

## 2024-02-22 DIAGNOSIS — H2512 Age-related nuclear cataract, left eye: Secondary | ICD-10-CM | POA: Diagnosis not present

## 2024-02-23 DIAGNOSIS — J3081 Allergic rhinitis due to animal (cat) (dog) hair and dander: Secondary | ICD-10-CM | POA: Diagnosis not present

## 2024-02-23 DIAGNOSIS — J3089 Other allergic rhinitis: Secondary | ICD-10-CM | POA: Diagnosis not present

## 2024-02-23 DIAGNOSIS — J301 Allergic rhinitis due to pollen: Secondary | ICD-10-CM | POA: Diagnosis not present

## 2024-03-01 DIAGNOSIS — J3089 Other allergic rhinitis: Secondary | ICD-10-CM | POA: Diagnosis not present

## 2024-03-01 DIAGNOSIS — J301 Allergic rhinitis due to pollen: Secondary | ICD-10-CM | POA: Diagnosis not present

## 2024-03-01 DIAGNOSIS — J3081 Allergic rhinitis due to animal (cat) (dog) hair and dander: Secondary | ICD-10-CM | POA: Diagnosis not present

## 2024-03-07 DIAGNOSIS — H268 Other specified cataract: Secondary | ICD-10-CM | POA: Diagnosis not present

## 2024-03-07 DIAGNOSIS — H2511 Age-related nuclear cataract, right eye: Secondary | ICD-10-CM | POA: Diagnosis not present

## 2024-03-07 DIAGNOSIS — H25811 Combined forms of age-related cataract, right eye: Secondary | ICD-10-CM | POA: Diagnosis not present

## 2024-03-07 DIAGNOSIS — Z961 Presence of intraocular lens: Secondary | ICD-10-CM | POA: Diagnosis not present

## 2024-03-07 DIAGNOSIS — H25011 Cortical age-related cataract, right eye: Secondary | ICD-10-CM | POA: Diagnosis not present

## 2024-03-08 DIAGNOSIS — J301 Allergic rhinitis due to pollen: Secondary | ICD-10-CM | POA: Diagnosis not present

## 2024-03-08 DIAGNOSIS — J3081 Allergic rhinitis due to animal (cat) (dog) hair and dander: Secondary | ICD-10-CM | POA: Diagnosis not present

## 2024-03-08 DIAGNOSIS — J3089 Other allergic rhinitis: Secondary | ICD-10-CM | POA: Diagnosis not present

## 2024-03-15 DIAGNOSIS — J3081 Allergic rhinitis due to animal (cat) (dog) hair and dander: Secondary | ICD-10-CM | POA: Diagnosis not present

## 2024-03-15 DIAGNOSIS — J3089 Other allergic rhinitis: Secondary | ICD-10-CM | POA: Diagnosis not present

## 2024-03-15 DIAGNOSIS — J301 Allergic rhinitis due to pollen: Secondary | ICD-10-CM | POA: Diagnosis not present

## 2024-03-22 DIAGNOSIS — J3089 Other allergic rhinitis: Secondary | ICD-10-CM | POA: Diagnosis not present

## 2024-03-22 DIAGNOSIS — J3081 Allergic rhinitis due to animal (cat) (dog) hair and dander: Secondary | ICD-10-CM | POA: Diagnosis not present

## 2024-03-22 DIAGNOSIS — J301 Allergic rhinitis due to pollen: Secondary | ICD-10-CM | POA: Diagnosis not present

## 2024-03-29 DIAGNOSIS — J3089 Other allergic rhinitis: Secondary | ICD-10-CM | POA: Diagnosis not present

## 2024-03-29 DIAGNOSIS — J3081 Allergic rhinitis due to animal (cat) (dog) hair and dander: Secondary | ICD-10-CM | POA: Diagnosis not present

## 2024-03-29 DIAGNOSIS — J301 Allergic rhinitis due to pollen: Secondary | ICD-10-CM | POA: Diagnosis not present

## 2024-04-06 DIAGNOSIS — J301 Allergic rhinitis due to pollen: Secondary | ICD-10-CM | POA: Diagnosis not present

## 2024-04-06 DIAGNOSIS — J3089 Other allergic rhinitis: Secondary | ICD-10-CM | POA: Diagnosis not present

## 2024-04-07 DIAGNOSIS — E113211 Type 2 diabetes mellitus with mild nonproliferative diabetic retinopathy with macular edema, right eye: Secondary | ICD-10-CM | POA: Diagnosis not present

## 2024-04-07 DIAGNOSIS — Z961 Presence of intraocular lens: Secondary | ICD-10-CM | POA: Diagnosis not present

## 2024-04-19 DIAGNOSIS — J3089 Other allergic rhinitis: Secondary | ICD-10-CM | POA: Diagnosis not present

## 2024-04-19 DIAGNOSIS — J3081 Allergic rhinitis due to animal (cat) (dog) hair and dander: Secondary | ICD-10-CM | POA: Diagnosis not present

## 2024-04-19 DIAGNOSIS — J301 Allergic rhinitis due to pollen: Secondary | ICD-10-CM | POA: Diagnosis not present

## 2024-04-26 DIAGNOSIS — J301 Allergic rhinitis due to pollen: Secondary | ICD-10-CM | POA: Diagnosis not present

## 2024-04-26 DIAGNOSIS — J3089 Other allergic rhinitis: Secondary | ICD-10-CM | POA: Diagnosis not present

## 2024-04-26 DIAGNOSIS — J3081 Allergic rhinitis due to animal (cat) (dog) hair and dander: Secondary | ICD-10-CM | POA: Diagnosis not present

## 2024-04-27 DIAGNOSIS — I10 Essential (primary) hypertension: Secondary | ICD-10-CM | POA: Diagnosis not present

## 2024-04-27 DIAGNOSIS — E1169 Type 2 diabetes mellitus with other specified complication: Secondary | ICD-10-CM | POA: Diagnosis not present

## 2024-04-27 DIAGNOSIS — E785 Hyperlipidemia, unspecified: Secondary | ICD-10-CM | POA: Diagnosis not present

## 2024-04-27 DIAGNOSIS — L309 Dermatitis, unspecified: Secondary | ICD-10-CM | POA: Diagnosis not present

## 2024-05-03 DIAGNOSIS — J3081 Allergic rhinitis due to animal (cat) (dog) hair and dander: Secondary | ICD-10-CM | POA: Diagnosis not present

## 2024-05-03 DIAGNOSIS — J301 Allergic rhinitis due to pollen: Secondary | ICD-10-CM | POA: Diagnosis not present

## 2024-05-03 DIAGNOSIS — J3089 Other allergic rhinitis: Secondary | ICD-10-CM | POA: Diagnosis not present

## 2024-05-10 DIAGNOSIS — J3089 Other allergic rhinitis: Secondary | ICD-10-CM | POA: Diagnosis not present

## 2024-05-10 DIAGNOSIS — J3081 Allergic rhinitis due to animal (cat) (dog) hair and dander: Secondary | ICD-10-CM | POA: Diagnosis not present

## 2024-05-10 DIAGNOSIS — J301 Allergic rhinitis due to pollen: Secondary | ICD-10-CM | POA: Diagnosis not present

## 2024-05-17 DIAGNOSIS — J3089 Other allergic rhinitis: Secondary | ICD-10-CM | POA: Diagnosis not present

## 2024-05-17 DIAGNOSIS — J3081 Allergic rhinitis due to animal (cat) (dog) hair and dander: Secondary | ICD-10-CM | POA: Diagnosis not present

## 2024-05-17 DIAGNOSIS — J301 Allergic rhinitis due to pollen: Secondary | ICD-10-CM | POA: Diagnosis not present

## 2024-05-24 DIAGNOSIS — J301 Allergic rhinitis due to pollen: Secondary | ICD-10-CM | POA: Diagnosis not present

## 2024-05-24 DIAGNOSIS — J3081 Allergic rhinitis due to animal (cat) (dog) hair and dander: Secondary | ICD-10-CM | POA: Diagnosis not present

## 2024-05-24 DIAGNOSIS — J3089 Other allergic rhinitis: Secondary | ICD-10-CM | POA: Diagnosis not present

## 2024-05-26 DIAGNOSIS — J301 Allergic rhinitis due to pollen: Secondary | ICD-10-CM | POA: Diagnosis not present

## 2024-05-26 DIAGNOSIS — J3089 Other allergic rhinitis: Secondary | ICD-10-CM | POA: Diagnosis not present

## 2024-06-07 DIAGNOSIS — J3089 Other allergic rhinitis: Secondary | ICD-10-CM | POA: Diagnosis not present

## 2024-06-07 DIAGNOSIS — J301 Allergic rhinitis due to pollen: Secondary | ICD-10-CM | POA: Diagnosis not present

## 2024-06-14 DIAGNOSIS — J301 Allergic rhinitis due to pollen: Secondary | ICD-10-CM | POA: Diagnosis not present

## 2024-06-14 DIAGNOSIS — J3089 Other allergic rhinitis: Secondary | ICD-10-CM | POA: Diagnosis not present

## 2024-06-14 DIAGNOSIS — J3081 Allergic rhinitis due to animal (cat) (dog) hair and dander: Secondary | ICD-10-CM | POA: Diagnosis not present

## 2024-06-15 DIAGNOSIS — E113211 Type 2 diabetes mellitus with mild nonproliferative diabetic retinopathy with macular edema, right eye: Secondary | ICD-10-CM | POA: Diagnosis not present

## 2024-06-21 DIAGNOSIS — J3081 Allergic rhinitis due to animal (cat) (dog) hair and dander: Secondary | ICD-10-CM | POA: Diagnosis not present

## 2024-06-21 DIAGNOSIS — Z79899 Other long term (current) drug therapy: Secondary | ICD-10-CM | POA: Diagnosis not present

## 2024-06-21 DIAGNOSIS — J3089 Other allergic rhinitis: Secondary | ICD-10-CM | POA: Diagnosis not present

## 2024-06-21 DIAGNOSIS — U071 COVID-19: Secondary | ICD-10-CM | POA: Diagnosis not present

## 2024-06-21 DIAGNOSIS — J301 Allergic rhinitis due to pollen: Secondary | ICD-10-CM | POA: Diagnosis not present

## 2024-06-21 DIAGNOSIS — T63441D Toxic effect of venom of bees, accidental (unintentional), subsequent encounter: Secondary | ICD-10-CM | POA: Diagnosis not present

## 2024-06-28 DIAGNOSIS — J3089 Other allergic rhinitis: Secondary | ICD-10-CM | POA: Diagnosis not present

## 2024-06-28 DIAGNOSIS — J3081 Allergic rhinitis due to animal (cat) (dog) hair and dander: Secondary | ICD-10-CM | POA: Diagnosis not present

## 2024-06-28 DIAGNOSIS — J301 Allergic rhinitis due to pollen: Secondary | ICD-10-CM | POA: Diagnosis not present

## 2024-06-29 DIAGNOSIS — I10 Essential (primary) hypertension: Secondary | ICD-10-CM | POA: Diagnosis not present

## 2024-06-29 DIAGNOSIS — E11319 Type 2 diabetes mellitus with unspecified diabetic retinopathy without macular edema: Secondary | ICD-10-CM | POA: Diagnosis not present

## 2024-06-29 DIAGNOSIS — M1990 Unspecified osteoarthritis, unspecified site: Secondary | ICD-10-CM | POA: Diagnosis not present

## 2024-06-29 DIAGNOSIS — M4800 Spinal stenosis, site unspecified: Secondary | ICD-10-CM | POA: Diagnosis not present

## 2024-06-29 DIAGNOSIS — J301 Allergic rhinitis due to pollen: Secondary | ICD-10-CM | POA: Diagnosis not present

## 2024-06-29 DIAGNOSIS — Z7982 Long term (current) use of aspirin: Secondary | ICD-10-CM | POA: Diagnosis not present

## 2024-06-29 DIAGNOSIS — Z7984 Long term (current) use of oral hypoglycemic drugs: Secondary | ICD-10-CM | POA: Diagnosis not present

## 2024-06-29 DIAGNOSIS — E785 Hyperlipidemia, unspecified: Secondary | ICD-10-CM | POA: Diagnosis not present

## 2024-06-29 DIAGNOSIS — Z833 Family history of diabetes mellitus: Secondary | ICD-10-CM | POA: Diagnosis not present

## 2024-07-05 DIAGNOSIS — J3081 Allergic rhinitis due to animal (cat) (dog) hair and dander: Secondary | ICD-10-CM | POA: Diagnosis not present

## 2024-07-05 DIAGNOSIS — J301 Allergic rhinitis due to pollen: Secondary | ICD-10-CM | POA: Diagnosis not present

## 2024-07-05 DIAGNOSIS — J3089 Other allergic rhinitis: Secondary | ICD-10-CM | POA: Diagnosis not present

## 2024-07-12 DIAGNOSIS — J301 Allergic rhinitis due to pollen: Secondary | ICD-10-CM | POA: Diagnosis not present

## 2024-07-12 DIAGNOSIS — J3089 Other allergic rhinitis: Secondary | ICD-10-CM | POA: Diagnosis not present

## 2024-07-12 DIAGNOSIS — J3081 Allergic rhinitis due to animal (cat) (dog) hair and dander: Secondary | ICD-10-CM | POA: Diagnosis not present

## 2024-07-19 DIAGNOSIS — J301 Allergic rhinitis due to pollen: Secondary | ICD-10-CM | POA: Diagnosis not present

## 2024-07-19 DIAGNOSIS — J3089 Other allergic rhinitis: Secondary | ICD-10-CM | POA: Diagnosis not present

## 2024-07-26 DIAGNOSIS — J301 Allergic rhinitis due to pollen: Secondary | ICD-10-CM | POA: Diagnosis not present

## 2024-07-26 DIAGNOSIS — J3089 Other allergic rhinitis: Secondary | ICD-10-CM | POA: Diagnosis not present

## 2024-08-02 DIAGNOSIS — J3081 Allergic rhinitis due to animal (cat) (dog) hair and dander: Secondary | ICD-10-CM | POA: Diagnosis not present

## 2024-08-02 DIAGNOSIS — J3089 Other allergic rhinitis: Secondary | ICD-10-CM | POA: Diagnosis not present

## 2024-08-02 DIAGNOSIS — J301 Allergic rhinitis due to pollen: Secondary | ICD-10-CM | POA: Diagnosis not present

## 2024-08-08 DIAGNOSIS — M5430 Sciatica, unspecified side: Secondary | ICD-10-CM | POA: Diagnosis not present

## 2024-08-08 DIAGNOSIS — E785 Hyperlipidemia, unspecified: Secondary | ICD-10-CM | POA: Diagnosis not present

## 2024-08-08 DIAGNOSIS — I10 Essential (primary) hypertension: Secondary | ICD-10-CM | POA: Diagnosis not present

## 2024-08-08 DIAGNOSIS — E1169 Type 2 diabetes mellitus with other specified complication: Secondary | ICD-10-CM | POA: Diagnosis not present

## 2024-08-09 DIAGNOSIS — J301 Allergic rhinitis due to pollen: Secondary | ICD-10-CM | POA: Diagnosis not present

## 2024-08-09 DIAGNOSIS — J3089 Other allergic rhinitis: Secondary | ICD-10-CM | POA: Diagnosis not present

## 2024-08-09 DIAGNOSIS — J3081 Allergic rhinitis due to animal (cat) (dog) hair and dander: Secondary | ICD-10-CM | POA: Diagnosis not present

## 2024-08-16 DIAGNOSIS — J3081 Allergic rhinitis due to animal (cat) (dog) hair and dander: Secondary | ICD-10-CM | POA: Diagnosis not present

## 2024-08-16 DIAGNOSIS — J301 Allergic rhinitis due to pollen: Secondary | ICD-10-CM | POA: Diagnosis not present

## 2024-08-16 DIAGNOSIS — J3089 Other allergic rhinitis: Secondary | ICD-10-CM | POA: Diagnosis not present

## 2024-08-23 DIAGNOSIS — J301 Allergic rhinitis due to pollen: Secondary | ICD-10-CM | POA: Diagnosis not present

## 2024-08-23 DIAGNOSIS — J3089 Other allergic rhinitis: Secondary | ICD-10-CM | POA: Diagnosis not present

## 2024-08-30 DIAGNOSIS — J301 Allergic rhinitis due to pollen: Secondary | ICD-10-CM | POA: Diagnosis not present

## 2024-08-30 DIAGNOSIS — J3089 Other allergic rhinitis: Secondary | ICD-10-CM | POA: Diagnosis not present

## 2024-08-30 DIAGNOSIS — J3081 Allergic rhinitis due to animal (cat) (dog) hair and dander: Secondary | ICD-10-CM | POA: Diagnosis not present

## 2024-08-31 DIAGNOSIS — Z1231 Encounter for screening mammogram for malignant neoplasm of breast: Secondary | ICD-10-CM | POA: Diagnosis not present
